# Patient Record
Sex: Male | Born: 1935 | Race: White | Hispanic: No | Smoking: Former smoker
Health system: Southern US, Community
[De-identification: ages and names within clinical notes are randomized; demographics above are authoritative.]

## PROBLEM LIST (undated history)

## (undated) DIAGNOSIS — I639 Cerebral infarction, unspecified: Secondary | ICD-10-CM

## (undated) DIAGNOSIS — D696 Thrombocytopenia, unspecified: Secondary | ICD-10-CM

## (undated) DIAGNOSIS — K219 Gastro-esophageal reflux disease without esophagitis: Secondary | ICD-10-CM

## (undated) DIAGNOSIS — I10 Essential (primary) hypertension: Secondary | ICD-10-CM

## (undated) DIAGNOSIS — E119 Type 2 diabetes mellitus without complications: Secondary | ICD-10-CM

## (undated) HISTORY — DX: Cerebral infarction, unspecified: I63.9

---

## 2004-08-07 ENCOUNTER — Ambulatory Visit: Payer: Self-pay | Admitting: Family Medicine

## 2004-09-14 ENCOUNTER — Emergency Department: Payer: Self-pay | Admitting: Emergency Medicine

## 2004-10-11 ENCOUNTER — Inpatient Hospital Stay: Payer: Self-pay

## 2006-06-13 ENCOUNTER — Ambulatory Visit: Payer: Self-pay | Admitting: Ophthalmology

## 2006-06-18 ENCOUNTER — Ambulatory Visit: Payer: Self-pay | Admitting: Ophthalmology

## 2006-07-17 ENCOUNTER — Ambulatory Visit: Payer: Self-pay | Admitting: Ophthalmology

## 2009-10-11 ENCOUNTER — Ambulatory Visit: Payer: Self-pay | Admitting: Unknown Physician Specialty

## 2010-02-23 ENCOUNTER — Telehealth (INDEPENDENT_AMBULATORY_CARE_PROVIDER_SITE_OTHER): Payer: Self-pay | Admitting: *Deleted

## 2010-02-23 ENCOUNTER — Ambulatory Visit: Payer: Self-pay | Admitting: Cardiovascular Disease

## 2010-02-23 DIAGNOSIS — R0602 Shortness of breath: Secondary | ICD-10-CM

## 2010-02-23 DIAGNOSIS — R42 Dizziness and giddiness: Secondary | ICD-10-CM

## 2010-02-23 DIAGNOSIS — R9431 Abnormal electrocardiogram [ECG] [EKG]: Secondary | ICD-10-CM

## 2010-02-27 ENCOUNTER — Ambulatory Visit: Payer: Self-pay | Admitting: Cardiovascular Disease

## 2010-02-27 ENCOUNTER — Encounter: Payer: Self-pay | Admitting: Cardiovascular Disease

## 2010-02-27 ENCOUNTER — Ambulatory Visit: Payer: Self-pay

## 2010-02-27 ENCOUNTER — Encounter (HOSPITAL_COMMUNITY): Admission: RE | Admit: 2010-02-27 | Discharge: 2010-04-19 | Payer: Self-pay | Admitting: Cardiovascular Disease

## 2010-02-28 ENCOUNTER — Ambulatory Visit: Payer: Self-pay

## 2010-02-28 ENCOUNTER — Encounter: Payer: Self-pay | Admitting: Cardiovascular Disease

## 2010-09-13 NOTE — Progress Notes (Signed)
Summary: Nuclear Pre-procedure  Phone Note Outgoing Call Call back at [facility] Phone [phone]   Call placed by: Stanton Kidney, EMT-P,  February 23, 2010 4:15 PM Call placed to: Patient Action Taken: Phone Call Completed Summary of Call: Reviewed information on Myoview Information Sheet (see scanned document for further details).  Spoke with Patient.    Nuclear Med Background Indications for Stress Test: Evaluation for Ischemia     Symptoms: Dizziness, SOB    Nuclear Pre-Procedure Height (in): 70

## 2010-09-13 NOTE — Assessment & Plan Note (Signed)
Summary: Cardiology Nuclear Testing  Nuclear Med Background Indications for Stress Test: Evaluation for Ischemia     Symptoms: Dizziness, SOB    Nuclear Pre-Procedure Caffeine/Decaff Intake: None NPO After: 10:00 PM Lungs: clear IV 0.9% NS with Angio Cath: 22g     IV Site: (R) Hand IV Started by: Stanton Kidney EMT-P Chest Size (in) 48     Height (in): 70.5 Weight (lb): 217 BMI: 30.81  Nuclear Med Study 1 or 2 day study:  1 day     Stress Test Type:  Eugenie Birks Reading MD:  Charlton Haws, MD     Referring MD:  T.Gollan Resting Radionuclide:  Technetium 36m Tetrofosmin     Resting Radionuclide Dose:  11.0 mCi  Stress Radionuclide:  Technetium 18m Tetrofosmin     Stress Radionuclide Dose:  33.0 mCi   Stress Protocol   Lexiscan: 0.4 mg   Stress Test Technologist:  Milana Na EMT-P     Nuclear Technologist:  Burna Mortimer Deal RT-N  Rest Procedure  Myocardial perfusion imaging was performed at rest 45 minutes following the intravenous administration of Myoview Technetium 71m Tetrofosmin.  Stress Procedure  The patient received IV Lexiscan 0.4 mg over 15-seconds.  Myoview injected at 30-seconds.  There were no significant changes with infusion.  Quantitative spect images were obtained after a 45 minute delay.  QPS Raw Data Images:  Normal; no motion artifact; normal heart/lung ratio. Stress Images:  NI: Uniform and normal uptake of tracer in all myocardial segments. Rest Images:  Normal homogeneous uptake in all areas of the myocardium. Subtraction (SDS):  Normal Transient Ischemic Dilatation:  1.13  (Normal <1.22)  Lung/Heart Ratio:  .27  (Normal <0.45)  Quantitative Gated Spect Images QGS EDV:  61 ml QGS ESV:  20 ml QGS EF:  68 % QGS cine images:  normal  Findings Normal nuclear study      Overall Impression  Exercise Capacity: Lexiscan BP Response: Normal blood pressure response. Clinical Symptoms: Chest Tightness ECG Impression: No significant ST segment  change suggestive of ischemia. Overall Impression: Normal stress nuclear study. Overall Impression Comments: normal  Appended Document: Cardiology Nuclear Testing Preliminarily reviewed. Forwarded to MD desktop for review and signature   Appended Document: Cardiology Nuclear Testing stress test is normal. Still waiting for ECHO  Appended Document: Cardiology Nuclear Testing pt aware of results asked to fax results to Dr. Sullivan Lone.

## 2010-09-13 NOTE — Assessment & Plan Note (Signed)
Summary: NEW PT   Visit Type:  new pt visit Primary Provider:  Dr. Sullivan Lone  CC:  sob.....  History of Present Illness: Paul Martinez is a 77 year old gentleman who is a patient of Dr. Sullivan Lone with no known coronary artery disease, a long smoking history for 40 years one pack per day stopping in 1980s, with a significant family history of stroke on his mother and father's side who presents with increasing shortness of breath, dizziness over the past several weeks.  He reports that he plays softball and be active. With any significant activity, he has significant shortness of breath as well as some dizziness and has to stop. He has significant fatigue with taking a shower. He does have significant hip pain and is not sure if this is due to arthritis. This happens bilaterally. He denies any coughing, edema, diaphoresis. He denies any wheezing. Occasionally with his shortness of breath, he has some tightness in the chest.  EKG shows normal sinus rhythm with rate of 65 beats per minute, nonspecific ST changes in the anterolateral leads  Current Medications (verified): 1)  Niaspan 500 Mg Cr-Tabs (Niacin (Antihyperlipidemic)) .Marland Kitchen.. 1 Tab At Bedtime 2)  Pantoprazole Sodium 40 Mg Tbec (Pantoprazole Sodium) .Marland Kitchen.. 1 Tab Once Daily 3)  Lisinopril-Hydrochlorothiazide 20-25 Mg Tabs (Lisinopril-Hydrochlorothiazide) .Marland Kitchen.. 1 Tab Once Daily 4)  Mirtazapine 30 Mg Tabs (Mirtazapine) .Marland Kitchen.. 1 Tab Once Daily 5)  Aspirin 81 Mg Tbec (Aspirin) .... Take One Tablet By Mouth Daily 6)  Fish Oil 1000 Mg Caps (Omega-3 Fatty Acids) .Marland Kitchen.. 1 Cap Once Daily 7)  Ranitidine Hcl 150 Mg Caps (Ranitidine Hcl) .Marland Kitchen.. 1 Cap Two Times A Day  Allergies (verified): No Known Drug Allergies  Past History:  Review of Systems       The patient complains of dyspnea on exertion.  The patient denies fever, weight loss, weight gain, vision loss, decreased hearing, hoarseness, chest pain, syncope, peripheral edema, prolonged cough, abdominal pain,  incontinence, muscle weakness, depression, and enlarged lymph nodes.         Dizzy, chest tightness with SOB  Vital Signs:  Patient profile:   77 year old male Height:      70 inches Weight:      221 pounds BMI:     31.82 Pulse rate:   65 / minute Pulse rhythm:   irregular BP sitting:   136 / 74  (left arm) Cuff size:   large  Vitals Entered By: Danielle Rankin, CMA (February 23, 2010 10:19 AM)  Physical Exam  General:  Well developed, well nourished, in no acute distress. Head:  normocephalic and atraumatic Neck:  Neck supple, no JVD. No masses, thyromegaly or abnormal cervical nodes. Chest Wall:  no deformities or breast masses noted Lungs:  Clear bilaterally to auscultation and percussion. Heart:  Non-displaced PMI, chest non-tender; regular rate and rhythm, S1, S2 with I-II/VI SEM RSB, no rubs or gallops. Carotid upstroke normal, no bruit. . Pedals normal pulses. No edema, no varicosities. Abdomen:  Bowel sounds positive; abdomen soft and non-tender without masses Msk:  Back normal, normal gait. Muscle strength and tone normal. Pulses:  pulses normal in all 4 extremities Extremities:  No clubbing or cyanosis. Neurologic:  Alert and oriented x 3. Skin:  Intact without lesions or rashes. Psych:  Normal affect.   Impression & Recommendations:  Problem # 1:  DYSPNEA (ICD-786.05) etiology of his shortness of breath is uncertain. He does have some dizziness, abnormal EKG, some chest tightness with exertion in the setting of  shortness of breath. Given his age, EKG changes, inability to treadmill due to his severe hip discomfort, we will set him up for a Lexiscan.   we'll also set him up for an echocardiogram to rule out pulmonary hypertension, underlying valvular disease as a cause of his shortness of breath.  We will send these reports to Dr. Sullivan Lone as soon as they are available.  His updated medication list for this problem includes:    Lisinopril-hydrochlorothiazide 20-25 Mg Tabs  (Lisinopril-hydrochlorothiazide) .Marland Kitchen... 1 tab once daily    Aspirin 81 Mg Tbec (Aspirin) .Marland Kitchen... Take one tablet by mouth daily  Orders: Echocardiogram (Echo) Nuclear Stress Test (Nuc Stress Test)  Patient Instructions: 1)  Your physician has requested that you have an echocardiogram.  Echocardiography is a painless test that uses sound waves to create images of your heart. It provides your doctor with information about the size and shape of your heart and how well your heart's chambers and valves are working.  This procedure takes approximately one hour. There are no restrictions for this procedure. 2)  Your physician has requested that you have an [employer]  For further information please visit [email or web address]  Please follow instruction sheet, as given. 3)  Your physician recommends that you schedule a follow-up appointment as needed

## 2010-09-13 NOTE — Letter (Signed)
Summary: Historic Patient File  Historic Patient File   Imported By: [place] 02/23/2010 14:26:35  _____________________________________________________________________  External Attachment:    Type:   Image     Comment:   External Document

## 2010-12-19 ENCOUNTER — Ambulatory Visit: Payer: Self-pay | Admitting: Internal Medicine

## 2011-02-23 ENCOUNTER — Encounter: Payer: Self-pay | Admitting: Cardiovascular Disease

## 2013-09-06 ENCOUNTER — Inpatient Hospital Stay: Payer: Self-pay | Admitting: Internal Medicine

## 2013-09-06 LAB — URINALYSIS, COMPLETE
Bilirubin,UR: NEGATIVE
Blood: NEGATIVE
Glucose,UR: NEGATIVE mg/dL (ref 0–75)
Hyaline Cast: 7
KETONE: NEGATIVE
Leukocyte Esterase: NEGATIVE
Nitrite: NEGATIVE
Ph: 5 (ref 4.5–8.0)
Protein: NEGATIVE
RBC,UR: 1 /HPF (ref 0–5)
Specific Gravity: 1.02 (ref 1.003–1.030)
Squamous Epithelial: 1

## 2013-09-06 LAB — CBC
HCT: 46.1 % (ref 40.0–52.0)
HGB: 15.4 g/dL (ref 13.0–18.0)
MCH: 30.7 pg (ref 26.0–34.0)
MCHC: 33.3 g/dL (ref 32.0–36.0)
MCV: 92 fL (ref 80–100)
Platelet: 147 10*3/uL — ABNORMAL LOW (ref 150–440)
RBC: 5.01 10*6/uL (ref 4.40–5.90)
RDW: 12.9 % (ref 11.5–14.5)
WBC: 6.2 10*3/uL (ref 3.8–10.6)

## 2013-09-06 LAB — COMPREHENSIVE METABOLIC PANEL
AST: 35 U/L (ref 15–37)
Albumin: 3.9 g/dL (ref 3.4–5.0)
Alkaline Phosphatase: 86 U/L
Anion Gap: 6 — ABNORMAL LOW (ref 7–16)
BILIRUBIN TOTAL: 0.7 mg/dL (ref 0.2–1.0)
BUN: 23 mg/dL — ABNORMAL HIGH (ref 7–18)
CALCIUM: 8.8 mg/dL (ref 8.5–10.1)
CREATININE: 1.96 mg/dL — AB (ref 0.60–1.30)
Chloride: 105 mmol/L (ref 98–107)
Co2: 28 mmol/L (ref 21–32)
EGFR (African American): 36 — ABNORMAL LOW
EGFR (Non-African Amer.): 31 — ABNORMAL LOW
Glucose: 132 mg/dL — ABNORMAL HIGH (ref 65–99)
OSMOLALITY: 283 (ref 275–301)
Potassium: 3.8 mmol/L (ref 3.5–5.1)
SGPT (ALT): 55 U/L (ref 12–78)
SODIUM: 139 mmol/L (ref 136–145)
Total Protein: 7.3 g/dL (ref 6.4–8.2)

## 2013-09-06 LAB — TROPONIN I: Troponin-I: <0.02 ng/mL

## 2013-09-07 LAB — BASIC METABOLIC PANEL
Anion Gap: 3 — ABNORMAL LOW (ref 7–16)
BUN: 23 mg/dL — ABNORMAL HIGH (ref 7–18)
CHLORIDE: 108 mmol/L — AB (ref 98–107)
Calcium, Total: 8.7 mg/dL (ref 8.5–10.1)
Co2: 28 mmol/L (ref 21–32)
Creatinine: 1.93 mg/dL — ABNORMAL HIGH (ref 0.60–1.30)
EGFR (African American): 37 — ABNORMAL LOW
GFR CALC NON AF AMER: 32 — AB
Glucose: 106 mg/dL — ABNORMAL HIGH (ref 65–99)
Osmolality: 282 (ref 275–301)
POTASSIUM: 4.3 mmol/L (ref 3.5–5.1)
Sodium: 139 mmol/L (ref 136–145)

## 2013-09-07 LAB — CBC WITH DIFFERENTIAL/PLATELET
BASOS PCT: 0.7 %
Basophil #: 0 10*3/uL (ref 0.0–0.1)
Eosinophil #: 0.2 10*3/uL (ref 0.0–0.7)
Eosinophil %: 2.6 %
HCT: 43.9 % (ref 40.0–52.0)
HGB: 14.6 g/dL (ref 13.0–18.0)
LYMPHS PCT: 47.2 %
Lymphocyte #: 2.9 10*3/uL (ref 1.0–3.6)
MCH: 31.1 pg (ref 26.0–34.0)
MCHC: 33.2 g/dL (ref 32.0–36.0)
MCV: 94 fL (ref 80–100)
MONO ABS: 0.5 x10 3/mm (ref 0.2–1.0)
Monocyte %: 8.5 %
NEUTROS ABS: 2.5 10*3/uL (ref 1.4–6.5)
NEUTROS PCT: 41 %
PLATELETS: 128 10*3/uL — AB (ref 150–440)
RBC: 4.69 10*6/uL (ref 4.40–5.90)
RDW: 12.9 % (ref 11.5–14.5)
WBC: 6.1 10*3/uL (ref 3.8–10.6)

## 2013-09-07 LAB — LIPID PANEL
Cholesterol: 176 mg/dL (ref 0–200)
HDL Cholesterol: 30 mg/dL — ABNORMAL LOW (ref 40–60)
LDL CHOLESTEROL, CALC: 100 mg/dL (ref 0–100)
Triglycerides: 231 mg/dL — ABNORMAL HIGH (ref 0–200)
VLDL Cholesterol, Calc: 46 mg/dL — ABNORMAL HIGH (ref 5–40)

## 2013-09-29 ENCOUNTER — Ambulatory Visit: Payer: Self-pay | Admitting: Internal Medicine

## 2014-12-04 NOTE — Discharge Summary (Signed)
PATIENT NAME:  Paul Martinez, Paul Martinez MR#:  [id number] DATE OF BIRTH:  [birth year]  DATE OF ADMISSION:  09/06/2013 DATE OF DISCHARGE:  09/07/2013  DISCHARGE DIAGNOSES:  1.  Slurred speech with right upper extremity numbness, possible cerebrovascular accident.  2.  History of previous cerebrovascular accident.  3.  Hypertension.  4.  Hyperlipidemia.  5.  Gastroesophageal reflux disease.  6.  Cervical degenerative disk disease.   CHIEF COMPLAINT: Slurred speech with right upper extremity numbness.   HISTORY OF PRESENT ILLNESS: Paul Martinez is an 81 year old male with a history of hypertension, hyperlipidemia, and acid reflux who presented to the ER complaining of right upper extremity pain associated with weakness and numbness. The patient has a past medical history significant for hypertension, hyperlipidemia, GERD, history of diverticulosis, and history of hemorrhoids.   PHYSICAL EXAMINATION: GENERAL: He was not in distress.  VITAL SIGNS: Blood pressure 128/74, heart rate 56, respirations 18, temp 98.1.  HEENT: NCAT. Oropharynx clear.  NECK: Supple. No bruits.  LUNGS: Clear to auscultation.  HEART: S1, S2.  ABDOMEN: Soft, nontender.  EXTREMITIES: No edema.  NEUROLOGIC: Alert and oriented x3. Cranial nerves intact. Deep tendon reflexes were symmetric and motor and sensory exam was nonfocal.  LABORATORY AND DIAGNOSTICS: Glucose 132, BUN 23, creatinine 1.96, GFR 31. Troponin less than 0.02. WBC count 6.3, hemoglobin 15.4.  Chest x-ray was unremarkable.   EKG is sinus rhythm. No acute changes.   CT of the head revealed no acute intracranial process. There was moderate to severe white matter changes and chronic small vessel ischemic disease with bilateral basal ganglia lacunar infarct.   CT of the spine revealed degenerative disk disease, moderate canal stenosis at C6 and C7, as well as narrowing at C2 and C3.   The patient also underwent a MRI of the brain which did not reveal any evidence  of acute infarct or other acute intracerebral abnormality. There were progressive chronic microvascular ischemic changes with chronic infarcts.  HOSPITAL COURSE: During his stay in the hospital, the patient remained stable. He did have a renal impairment with creatinine 1.93 and this had improved from 1.96 with IV fluids. BUN was 23, potassium 4.3, chloride 108, and CO2 of 28. Troponin was negative. He was ambulated and appeared stable at time of discharge. A carotid ultrasound showed bilateral carotid atherosclerotic vascular disease. No hemodynamically significant stenosis. The vertebrals were patent with antegrade flow. The patient was discharged in stable condition on the following medications.   DISCHARGE MEDICATIONS: 1.  Lisinopril/HCT 25/20 mg 1 tab once a day. 2.  Mirtazapine 30 mg once a day at bedtime. 3.  Pantoprazole 40 mg once a day. 4.  Aspirin 81 mg once a day. 5.  Fish oil 1200 mg t.i.d.  6.  Cyclobenzaprine 10 mg once a day at bedtime. 7.  Clopidogrel 75 mg once a day. 8.  Celecoxib 200 mg once a day. 9.  Aspirin 81 mg a day. 10.  Iron plus vitamin B complex with C and folic acid 1 tablet once a day.   DISCHARGE INSTRUCTIONS: The patient has been advised to follow with me, Dr. Marcello FennelHande, in 1 to 2 weeks' time, advised to call back with any questions or concerns.   TOTAL TIME SPENT ON DISCHARGE: 35 minutes.   ____________________________ Barbette ReichmannVishwanath Talina Pleitez, MD vh:sb D: 09/07/2013 12:52:29 ET T: 09/07/2013 13:21:21 ET JOB#: [id number]  cc: Barbette ReichmannVishwanath Asherah Lavoy, MD, <Dictator> Barbette ReichmannVISHWANATH Shontez Sermon MD ELECTRONICALLY SIGNED 09/22/2013 13:16

## 2014-12-04 NOTE — H&P (Signed)
PATIENT NAME:  [name], Ancel H MR#:  [id number] DATE OF BIRTH:  September 11, 1929  DATE OF ADMISSION:  09/06/2013  REFERRING PHYSICIAN: Dr. Zenda AlpersWebster in the Emergency Room.   FAMILY PHYSICIAN: Dr. Marcello FennelHande.   REASON FOR ADMISSION: Slurred speech with right upper extremity numbness.   HISTORY OF PRESENT ILLNESS: The patient is an 81 year old male with a history of benign hypertension, hyperlipidemia and reflux. He has a strong family history of stroke. Presents to the Emergency Room with slurred speech associated with right upper extremity weakness and numbness. In the Emergency Room, the patient's head CT revealed old lacunar infarcts with no acute abnormality. He was not aware of any previous stroke. He is now admitted for further evaluation.   PAST MEDICAL HISTORY:  1. Benign hypertension.  2. Hyperlipidemia.  3. GE reflux disease.  4. History of diverticulosis.  5. History of hemorrhoids.   MEDICATIONS:  1. Aspirin 81 mg p.o. daily.  2. Pravachol 40 mg p.o. at bedtime.  3. Protonix 40 mg p.o. daily.  4. Zestoretic 20/25, 1 p.o. daily.  5. Remeron 30 mg p.o. at bedtime.  6. Fish oil 1200 mg p.o. daily.   ALLERGIES: PENICILLIN.   SOCIAL HISTORY: Negative for alcohol or tobacco abuse.   FAMILY HISTORY: Positive for stroke, but otherwise unremarkable.   REVIEW OF SYSTEMS:  CONSTITUTIONAL: No fever or change in weight.  EYES: No blurred or double vision. No glaucoma.  ENT: No tinnitus or hearing loss. No nasal discharge or bleeding. No difficulty swallowing.  RESPIRATORY: No cough or wheezing. Denies hemoptysis.  CARDIOVASCULAR: No chest pain or orthopnea. No palpitations.  GASTROINTESTINAL: No nausea, vomiting, or diarrhea. No abdominal pain. No change in bowel habits.  GENITOURINARY: No dysuria or hematuria. No incontinence.  ENDOCRINE: No polyuria or polydipsia. No heat or cold intolerance.  HEMATOLOGIC: The patient denies anemia, easy bruising or bleeding.  LYMPHATIC: No swollen  glands.  MUSCULOSKELETAL: The patient denies pain in his neck, back, knees or hips. He is having some shoulder and right upper extremity pain.  NEUROLOGIC: Numbness and weakness as per HPI. No migraines. Denies seizures.  PSYCHIATRIC: The patient denies anxiety, insomnia or depression.   PHYSICAL EXAMINATION: GENERAL: The patient is in no acute distress.  VITAL SIGNS: Currently remarkable for a blood pressure of 128/74 with a heart rate of 56, respiratory rate of 18, temperature of 98.1.  HEENT: Normocephalic, atraumatic. Pupils equal, round, reactive to light and accommodation. Extraocular movements are intact. Sclerae are anicteric. Conjunctivae are clear.  OROPHARYNX: Clear.  NECK: Supple without bruits. No adenopathy or thyromegaly is noted. No JVD.  LUNGS: Clear to auscultation and percussion without wheezes, rales or rhonchi. No dullness. Respiratory effort is normal.  CARDIAC: Regular rate and rhythm with normal S1, S2. No significant rubs, murmurs or gallops. PMI is nondisplaced. Chest wall is nontender.  ABDOMEN: Soft, nontender, with normoactive bowel sounds. No organomegaly or masses were appreciated. No hernias or bruits were noted.  EXTREMITIES: Without clubbing, cyanosis or edema. Pulses were 2+ bilaterally.  SKIN: Warm and dry without rash or lesions.  NEUROLOGIC: Cranial nerves II through XII grossly intact. Deep tendon reflexes were symmetric. Motor and sensory examination is nonfocal.  PSYCHIATRIC: Exam revealed a patient who is alert and oriented to person, place, and time. He was cooperative and used good judgment.   LABORATORY DATA: Glucose is 132 with a BUN of 23, creatinine 1.96. GFR of 31. Troponin was less than 0.02. White count was 6.2 with a hemoglobin of 15.4. Chest  x-ray was unremarkable. EKG revealed sinus rhythm with no acute ischemic changes. CT of the head revealed no acute intracranial process with moderate to severe white matter changes and chronic small vessel  ischemic disease with bilateral basal ganglia lacunar infarct. CT of the C-spine revealed degenerative disk disease with moderate canal stenosis at C6-C7, as well as narrowing at C2 and C3.    ASSESSMENT: 1. Slurred speech with right upper extremity numbness worrisome for stroke.  2. Previous lacunar infarct by CT.  3. Cervical degenerative disk disease.  4. Benign hypertension.  5. Hyperlipidemia.  6. Family history of stroke.  7. Gastroesophageal reflux disease.   PLAN: The patient will be admitted to the floor with aspirin and Plavix. We will obtain carotid Dopplers and an echocardiogram. We will also proceed with MRI of the brain. We will monitor his blood pressure closely and check on his lipid status. Neuro checks q.4 hours. We will  consult physical therapy and occupational therapy. Begin Celebrex and Flexeril. Follow up routine labs in the morning. Further treatment and evaluation will depend upon the patient's progress.   TOTAL TIME SPENT: 50 minutes.    ____________________________ Duane Lope Judithann Sheen, MD jds:sg D: 09/06/2013 09:48:27 ET T: 09/06/2013 11:17:15 ET JOB#: [id number]  cc: Duane Lope. Judithann Sheen, MD, <Dictator> Barbette Reichmann, MD  Jamone Garrido Rodena Medin MD ELECTRONICALLY SIGNED 09/06/2013 14:40

## 2017-09-29 ENCOUNTER — Inpatient Hospital Stay
Admission: EM | Admit: 2017-09-29 | Discharge: 2017-09-30 | DRG: 084 | Disposition: A | Discharge status: Home / Self Care | Payer: Medicare Other | Attending: Internal Medicine | Admitting: Internal Medicine

## 2017-09-29 ENCOUNTER — Encounter: Payer: Self-pay | Admitting: Emergency Medicine

## 2017-09-29 ENCOUNTER — Emergency Department: Payer: Medicare Other

## 2017-09-29 DIAGNOSIS — N183 Chronic kidney disease, stage 3 (moderate): Secondary | ICD-10-CM | POA: Diagnosis not present

## 2017-09-29 DIAGNOSIS — S5001XA Contusion of right elbow, initial encounter: Secondary | ICD-10-CM | POA: Diagnosis not present

## 2017-09-29 DIAGNOSIS — S5011XA Contusion of right forearm, initial encounter: Secondary | ICD-10-CM | POA: Diagnosis not present

## 2017-09-29 DIAGNOSIS — S065X9A Traumatic subdural hemorrhage with loss of consciousness of unspecified duration, initial encounter: Secondary | ICD-10-CM | POA: Diagnosis not present

## 2017-09-29 DIAGNOSIS — E1122 Type 2 diabetes mellitus with diabetic chronic kidney disease: Secondary | ICD-10-CM | POA: Diagnosis present

## 2017-09-29 DIAGNOSIS — S8002XA Contusion of left knee, initial encounter: Secondary | ICD-10-CM

## 2017-09-29 DIAGNOSIS — Z880 Allergy status to penicillin: Secondary | ICD-10-CM

## 2017-09-29 DIAGNOSIS — F0390 Unspecified dementia without behavioral disturbance: Secondary | ICD-10-CM | POA: Diagnosis not present

## 2017-09-29 DIAGNOSIS — E041 Nontoxic single thyroid nodule: Secondary | ICD-10-CM | POA: Diagnosis present

## 2017-09-29 DIAGNOSIS — Z87891 Personal history of nicotine dependence: Secondary | ICD-10-CM | POA: Diagnosis not present

## 2017-09-29 DIAGNOSIS — Z7982 Long term (current) use of aspirin: Secondary | ICD-10-CM | POA: Diagnosis not present

## 2017-09-29 DIAGNOSIS — I129 Hypertensive chronic kidney disease with stage 1 through stage 4 chronic kidney disease, or unspecified chronic kidney disease: Secondary | ICD-10-CM | POA: Diagnosis not present

## 2017-09-29 DIAGNOSIS — Z8673 Personal history of transient ischemic attack (TIA), and cerebral infarction without residual deficits: Secondary | ICD-10-CM | POA: Diagnosis not present

## 2017-09-29 DIAGNOSIS — S92352A Displaced fracture of fifth metatarsal bone, left foot, initial encounter for closed fracture: Secondary | ICD-10-CM

## 2017-09-29 DIAGNOSIS — E1165 Type 2 diabetes mellitus with hyperglycemia: Secondary | ICD-10-CM | POA: Diagnosis not present

## 2017-09-29 DIAGNOSIS — Z79899 Other long term (current) drug therapy: Secondary | ICD-10-CM | POA: Diagnosis not present

## 2017-09-29 DIAGNOSIS — H9190 Unspecified hearing loss, unspecified ear: Secondary | ICD-10-CM | POA: Diagnosis not present

## 2017-09-29 DIAGNOSIS — S065X0A Traumatic subdural hemorrhage without loss of consciousness, initial encounter: Secondary | ICD-10-CM

## 2017-09-29 DIAGNOSIS — R52 Pain, unspecified: Secondary | ICD-10-CM

## 2017-09-29 DIAGNOSIS — M4692 Unspecified inflammatory spondylopathy, cervical region: Secondary | ICD-10-CM | POA: Diagnosis not present

## 2017-09-29 DIAGNOSIS — E1121 Type 2 diabetes mellitus with diabetic nephropathy: Secondary | ICD-10-CM | POA: Diagnosis not present

## 2017-09-29 DIAGNOSIS — W010XXA Fall on same level from slipping, tripping and stumbling without subsequent striking against object, initial encounter: Secondary | ICD-10-CM | POA: Diagnosis present

## 2017-09-29 DIAGNOSIS — I619 Nontraumatic intracerebral hemorrhage, unspecified: Secondary | ICD-10-CM | POA: Diagnosis present

## 2017-09-29 HISTORY — DX: Essential (primary) hypertension: I10

## 2017-09-29 HISTORY — DX: Type 2 diabetes mellitus without complications: E11.9

## 2017-09-29 NOTE — ED Triage Notes (Signed)
Pt comes into the ED via POV c/o fall from where he slipped in a wet bathroom.  Patient denies any LOC or discomfort other than right elbow and right knee.  The fall was witnessed but the patient did not hit his head.  Patient is neurologically intact and in NAD with even and unlabored respirations.

## 2017-09-29 NOTE — ED Notes (Signed)
Pt states he slipped on tile floor in the bathroom. Pt states he landed on his right posterior elbow. Pt states he did strike posterior skull on toilet when he fell. Pt also complains of left knee pain and left foot pain. Cms intact to all extremities. Pt denies loc or nausea/vomiting.

## 2017-09-29 NOTE — ED Provider Notes (Signed)
[facility] Emergency Department Provider Note   ____________________________________________   First MD Initiated Contact with Patient 09/29/17 2121     (approximate)  I have reviewed the triage vital signs and the nursing notes.   HISTORY  Chief Complaint Fall    HPI Paul Martinez is a 84 y.o. male patient states pain to the right elbow, right knee, and left foot secondary to a slip and fall.  Patient had a witnessed fall without LOC in a wet bathroom.  Patient hit the back of his head during the fall.  Patient denies vision disturbance or vertigo.  Patient rates pain as a 2/10.  Patient described the pain as "aching".  Patient is here with children and requires clearance to return back to nursing home.    Past Medical History:  Diagnosis Date  . Diabetes mellitus without complication (HCC)   . Hypertension     Patient Active Problem List   Diagnosis Date Noted  . DIZZINESS 02/23/2010  . DYSPNEA 02/23/2010  . ABNORMAL ELECTROCARDIOGRAM 02/23/2010    History reviewed. No pertinent surgical history.  Prior to Admission medications   Medication Sig Start Date End Date Taking? Authorizing Provider  aspirin 81 MG EC tablet Take 81 mg by mouth daily.      [provider]  lisinopril-hydrochlorothiazide (PRINZIDE,ZESTORETIC) 20-12.5 MG per tablet Take 1 tablet by mouth daily.      [provider]  mirtazapine (REMERON) 30 MG tablet Take 30 mg by mouth daily.      [provider]  niacin (NIASPAN) 500 MG CR tablet Take 500 mg by mouth at bedtime.      [provider]  Omega-3 Fatty Acids (FISH OIL) 1000 MG CAPS Take 1 capsule by mouth daily.      [provider]  pantoprazole (PROTONIX) 40 MG tablet Take 40 mg by mouth daily.      [provider]  ranitidine (ZANTAC) 150 MG capsule Take 150 mg by mouth 2 (two) times daily.      [provider]    Allergies Penicillins  No family  history on file.  Social History Social History   Tobacco Use  . Smoking status: Former Games developer  . Smokeless tobacco: Never Used  Substance Use Topics  . Alcohol use: No    Frequency: Never  . Drug use: No    Review of Systems Constitutional: No fever/chills Eyes: No visual changes. ENT: No sore throat. Cardiovascular: Denies chest pain. Respiratory: Denies shortness of breath. Gastrointestinal: No abdominal pain.  No nausea, no vomiting.  No diarrhea.  No constipation. Genitourinary: Negative for dysuria. Musculoskeletal: Right elbow, right knee, and left foot pain. Skin: Negative for rash. Neurological: Positive for headaches, but denies focal weakness or numbness. Endocrine:Diabetes and hypertension Allergic/Immunilogical: Penicillin ____________________________________________   PHYSICAL EXAM:  VITAL SIGNS: ED Triage Vitals  Enc Vitals Group     BP 09/29/17 2029 (!) 142/65     Pulse Rate 09/29/17 2029 72     Resp 09/29/17 2029 18     Temp 09/29/17 2029 98.1 F (36.7 C)     Temp Source 09/29/17 2029 Oral     SpO2 09/29/17 2029 94 %     Weight 09/29/17 2029 200 lb (90.7 kg)     Height 09/29/17 2029 5\' 11"  (1.803 m)     Head Circumference --      Peak Flow --      Pain Score 09/29/17 2052 2  Pain Loc --      Pain Edu? --      Excl. in GC? --    Constitutional: Alert and oriented. Well appearing and in no acute distress. Eyes: Conjunctivae are normal. PERRL. EOMI. Head: Atraumatic. Nose: No congestion/rhinnorhea. Mouth/Throat: Mucous membranes are moist.  Oropharynx non-erythematous. Neck: No stridor. No cervical spine tenderness to palpation. Cardiovascular: Normal rate, regular rhythm. Grossly normal heart sounds.  Good peripheral circulation. Respiratory: Normal respiratory effort.  No retractions. Lungs CTAB. Musculoskeletal: No obvious deformity to the right knee or left foot.  Patient had mild guarding palpation of the right knee. Neurologic:   Normal speech and language. No gross focal neurologic deficits are appreciated. No gait instability. Skin:  Skin is warm, dry and intact. No rash noted. Psychiatric: Mood and affect are normal. Speech and behavior are normal.  ____________________________________________   LABS (all labs ordered are listed, but only abnormal results are displayed)  Labs Reviewed - No data to display ____________________________________________  EKG   ____________________________________________  RADIOLOGY  ED MD interpretation: Patient x-ray is remarkable for left fifth metatarsal fracture.  X-rays of the right elbow and left knee were unremarkable.  Official radiology report(s): Dg Elbow 2 Views Right  Result Date: 09/29/2017 CLINICAL DATA:  Fall, pain to right elbow. EXAM: RIGHT ELBOW - 2 VIEW COMPARISON:  None. FINDINGS: Osseous alignment is normal. No fracture line or displaced fracture fragment identified. No appreciable joint effusion and adjacent soft tissues are unremarkable. Mild degenerative spurring at the radial head. IMPRESSION: No acute findings. No osseous fracture or dislocation. Mild degenerative spurring. Electronically Signed   By: Bary RichardStan  Maynard M.D.   On: 09/29/2017 22:11   Ct Head Wo Contrast  Result Date: 09/29/2017 CLINICAL DATA:  Larey SeatFell in bathroom on hit posterior skull on toilet EXAM: CT HEAD WITHOUT CONTRAST TECHNIQUE: Contiguous axial images were obtained from the base of the skull through the vertex without intravenous contrast. COMPARISON:  CT 09/06/2013, MRI brain 09/07/2013 FINDINGS: Brain: No acute territorial infarction or intracranial mass is visualized. Acute right anterior parafalcine subdural hematoma measuring 4 mm in maximum thickness with thin right frontal convexity subdural hematoma measuring 2 mm. No midline shift. No mass effect. Moderate atrophy. Moderate small vessel ischemic changes of the white matter. Old lacunar infarcts in the right basal ganglia. 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 Vascular: No hyperdense vessels. Carotid vascular calcification and vertebral calcification Skull: No depressed skull fracture Sinuses/Orbits: No acute finding. Other: None IMPRESSION: 1. Acute right anterior parafalcine subdural hematoma with thin right frontal convexity subdural hematoma. No midline shift. No significant mass effect 2. Atrophy with small vessel ischemic changes of the white matter and old left cerebellar and occipital infarcts. Critical Value/emergent results were called by telephone at the time of interpretation on 09/29/2017 at 10:28 pm to Dr. Nona DellONALD Roseanna Koplin , who verbally acknowledged these results. Electronically Signed   By: Jasmine PangKim  Fujinaga M.D.   On: 09/29/2017 22:28   Dg Foot Complete Left  Result Date: 09/29/2017 CLINICAL DATA:  Slip and fall on bathroom floor with left foot pain. EXAM: LEFT FOOT - COMPLETE 3+ VIEW COMPARISON:  None. FINDINGS: Oblique mildly displaced and minimally comminuted fifth metatarsal fracture. No intra-articular extension. No additional acute fracture of the foot. There are hammertoe deformity of the digits. IMPRESSION: Oblique mildly displaced fifth metatarsal fracture without intra-articular extension. Electronically Signed   By: Rubye OaksMelanie  Ehinger M.D.   On: 09/29/2017 22:12    ____________________________________________   PROCEDURES  Procedure(s) performed: None  Procedures  Critical Care performed: No  ____________________________________________   INITIAL IMPRESSION / ASSESSMENT AND PLAN / ED COURSE  As part of my medical decision making, I reviewed the following data within the electronic MEDICAL RECORD NUMBER    Patient CT is consistent with acute right anterior subdural hematoma without midline shift.  X-rays remarkable for left metatarsal fracture.  X-rays of the right elbow and left knee were unremarkable.  Patient will be transferred to major for definitive evaluation and treatment.       ____________________________________________   FINAL CLINICAL IMPRESSION(S) / ED DIAGNOSES  Final diagnoses:  Subdural hematoma without coma, without loss of consciousness, initial encounter (HCC)  Displaced fracture of fifth metatarsal bone, left foot, initial encounter for closed fracture  Contusion of right elbow and forearm, initial encounter  Contusion of left knee, initial encounter     ED Discharge Orders    None       Note:  This document was prepared using Dragon voice recognition software and may include unintentional dictation errors.    Joni Reining, PA-C 09/29/17 2250    Merrily Brittle, MD 09/30/17 [phone]

## 2017-09-30 ENCOUNTER — Other Ambulatory Visit: Payer: Self-pay

## 2017-09-30 ENCOUNTER — Encounter: Payer: Self-pay | Admitting: *Deleted

## 2017-09-30 ENCOUNTER — Inpatient Hospital Stay: Payer: Medicare Other

## 2017-09-30 DIAGNOSIS — E1165 Type 2 diabetes mellitus with hyperglycemia: Secondary | ICD-10-CM | POA: Diagnosis not present

## 2017-09-30 DIAGNOSIS — S8002XA Contusion of left knee, initial encounter: Secondary | ICD-10-CM | POA: Diagnosis not present

## 2017-09-30 DIAGNOSIS — S5011XA Contusion of right forearm, initial encounter: Secondary | ICD-10-CM | POA: Diagnosis not present

## 2017-09-30 DIAGNOSIS — E1122 Type 2 diabetes mellitus with diabetic chronic kidney disease: Secondary | ICD-10-CM | POA: Diagnosis not present

## 2017-09-30 DIAGNOSIS — W010XXA Fall on same level from slipping, tripping and stumbling without subsequent striking against object, initial encounter: Secondary | ICD-10-CM | POA: Diagnosis not present

## 2017-09-30 DIAGNOSIS — Z880 Allergy status to penicillin: Secondary | ICD-10-CM | POA: Diagnosis not present

## 2017-09-30 DIAGNOSIS — I129 Hypertensive chronic kidney disease with stage 1 through stage 4 chronic kidney disease, or unspecified chronic kidney disease: Secondary | ICD-10-CM | POA: Diagnosis not present

## 2017-09-30 DIAGNOSIS — N183 Chronic kidney disease, stage 3 (moderate): Secondary | ICD-10-CM | POA: Diagnosis not present

## 2017-09-30 DIAGNOSIS — S065X9A Traumatic subdural hemorrhage with loss of consciousness of unspecified duration, initial encounter: Secondary | ICD-10-CM | POA: Diagnosis not present

## 2017-09-30 DIAGNOSIS — M4692 Unspecified inflammatory spondylopathy, cervical region: Secondary | ICD-10-CM | POA: Diagnosis not present

## 2017-09-30 DIAGNOSIS — I619 Nontraumatic intracerebral hemorrhage, unspecified: Secondary | ICD-10-CM | POA: Diagnosis present

## 2017-09-30 DIAGNOSIS — S92352A Displaced fracture of fifth metatarsal bone, left foot, initial encounter for closed fracture: Secondary | ICD-10-CM | POA: Diagnosis not present

## 2017-09-30 DIAGNOSIS — R52 Pain, unspecified: Secondary | ICD-10-CM | POA: Diagnosis present

## 2017-09-30 DIAGNOSIS — H9190 Unspecified hearing loss, unspecified ear: Secondary | ICD-10-CM | POA: Diagnosis not present

## 2017-09-30 DIAGNOSIS — S5001XA Contusion of right elbow, initial encounter: Secondary | ICD-10-CM | POA: Diagnosis not present

## 2017-09-30 DIAGNOSIS — Z79899 Other long term (current) drug therapy: Secondary | ICD-10-CM | POA: Diagnosis not present

## 2017-09-30 DIAGNOSIS — E041 Nontoxic single thyroid nodule: Secondary | ICD-10-CM | POA: Diagnosis not present

## 2017-09-30 DIAGNOSIS — Z7982 Long term (current) use of aspirin: Secondary | ICD-10-CM | POA: Diagnosis not present

## 2017-09-30 DIAGNOSIS — Z8673 Personal history of transient ischemic attack (TIA), and cerebral infarction without residual deficits: Secondary | ICD-10-CM | POA: Diagnosis not present

## 2017-09-30 DIAGNOSIS — E1121 Type 2 diabetes mellitus with diabetic nephropathy: Secondary | ICD-10-CM | POA: Diagnosis not present

## 2017-09-30 DIAGNOSIS — F0390 Unspecified dementia without behavioral disturbance: Secondary | ICD-10-CM | POA: Diagnosis not present

## 2017-09-30 DIAGNOSIS — Z87891 Personal history of nicotine dependence: Secondary | ICD-10-CM | POA: Diagnosis not present

## 2017-09-30 LAB — URINALYSIS, COMPLETE (UACMP) WITH MICROSCOPIC
BILIRUBIN URINE: NEGATIVE
Bacteria, UA: NONE SEEN
Hgb urine dipstick: NEGATIVE
KETONES UR: 5 mg/dL — AB
LEUKOCYTES UA: NEGATIVE
NITRITE: NEGATIVE
PH: 5 (ref 5.0–8.0)
Protein, ur: NEGATIVE mg/dL
Specific Gravity, Urine: 1.031 — ABNORMAL HIGH (ref 1.005–1.030)

## 2017-09-30 LAB — TYPE AND SCREEN
ABO/RH(D): O POS
Antibody Screen: NEGATIVE

## 2017-09-30 LAB — BASIC METABOLIC PANEL
Anion gap: 8 (ref 5–15)
BUN: 14 mg/dL (ref 6–20)
CHLORIDE: 104 mmol/L (ref 101–111)
CO2: 25 mmol/L (ref 22–32)
CREATININE: 1.35 mg/dL — AB (ref 0.61–1.24)
Calcium: 9.2 mg/dL (ref 8.9–10.3)
GFR calc non Af Amer: 46 mL/min — ABNORMAL LOW (ref >60)
GFR, EST AFRICAN AMERICAN: 54 mL/min — AB (ref >60)
GLUCOSE: 325 mg/dL — AB (ref 65–99)
Potassium: 4 mmol/L (ref 3.5–5.1)
Sodium: 137 mmol/L (ref 135–145)

## 2017-09-30 LAB — MRSA PCR SCREENING: MRSA BY PCR: NEGATIVE

## 2017-09-30 LAB — HEMOGLOBIN A1C
HEMOGLOBIN A1C: 13.2 % — AB (ref 4.8–5.6)
MEAN PLASMA GLUCOSE: 332.14 mg/dL

## 2017-09-30 LAB — CBC
HEMATOCRIT: 43.4 % (ref 40.0–52.0)
HEMOGLOBIN: 14.4 g/dL (ref 13.0–18.0)
MCH: 30.6 pg (ref 26.0–34.0)
MCHC: 33.1 g/dL (ref 32.0–36.0)
MCV: 92.3 fL (ref 80.0–100.0)
Platelets: 89 10*3/uL — ABNORMAL LOW (ref 150–440)
RBC: 4.7 MIL/uL (ref 4.40–5.90)
RDW: 13.9 % (ref 11.5–14.5)
WBC: 5.5 10*3/uL (ref 3.8–10.6)

## 2017-09-30 LAB — GLUCOSE, CAPILLARY
Glucose-Capillary: 370 mg/dL — ABNORMAL HIGH (ref 65–99)
Glucose-Capillary: 362 mg/dL — ABNORMAL HIGH (ref 65–99)

## 2017-09-30 LAB — PREPARE PLATELET PHERESIS: Unit division: 0

## 2017-09-30 LAB — PROTIME-INR
INR: 1.13
Prothrombin Time: 14.4 seconds (ref 11.4–15.2)

## 2017-09-30 LAB — BPAM PLATELET PHERESIS
Blood Product Expiration Date: 201902182359
Unit Type and Rh: 5100

## 2017-09-30 LAB — TROPONIN I: Troponin I: <0.03 ng/mL (ref <0.03)

## 2017-09-30 MED ORDER — ONDANSETRON HCL 4 MG/2ML IJ SOLN: 4.0000 mg | Freq: Four times a day (QID) | INTRAMUSCULAR | Status: DC | PRN

## 2017-09-30 MED ORDER — VITAMIN B-12 1000 MCG PO TABS: 1000.0000 ug | ORAL_TABLET | Freq: Every day | ORAL | Status: DC

## 2017-09-30 MED ORDER — INSULIN ASPART 100 UNIT/ML ~~LOC~~ SOLN: 0.0000 [IU] | Freq: Every day | SUBCUTANEOUS | Status: DC

## 2017-09-30 MED ORDER — INFLUENZA VAC SPLIT HIGH-DOSE 0.5 ML IM SUSY: 0.5000 mL | PREFILLED_SYRINGE | INTRAMUSCULAR | Status: DC

## 2017-09-30 MED ORDER — NIACIN ER (ANTIHYPERLIPIDEMIC) 500 MG PO TBCR
500.0000 mg | EXTENDED_RELEASE_TABLET | Freq: Every day | ORAL | Status: DC
  Filled 2017-09-30: qty 1

## 2017-09-30 MED ORDER — LISINOPRIL 20 MG PO TABS
40.0000 mg | ORAL_TABLET | Freq: Every day | ORAL | Status: DC
  Administered 2017-09-30: 10:00:00 40 mg via ORAL
  Filled 2017-09-30: qty 2

## 2017-09-30 MED ORDER — SODIUM CHLORIDE 0.9% FLUSH
3.0000 mL | Freq: Two times a day (BID) | INTRAVENOUS | Status: DC
  Administered 2017-09-30: 3 mL via INTRAVENOUS

## 2017-09-30 MED ORDER — SERTRALINE HCL 50 MG PO TABS: 50.0000 mg | ORAL_TABLET | Freq: Every day | ORAL | Status: DC

## 2017-09-30 MED ORDER — DOCUSATE SODIUM 100 MG PO CAPS
100.0000 mg | ORAL_CAPSULE | Freq: Two times a day (BID) | ORAL | Status: DC
  Administered 2017-09-30: 100 mg via ORAL
  Filled 2017-09-30: qty 1

## 2017-09-30 MED ORDER — PRESERVISION AREDS 2+MULTI VIT PO CAPS: ORAL_CAPSULE | Freq: Two times a day (BID) | ORAL | Status: DC

## 2017-09-30 MED ORDER — MIRTAZAPINE 15 MG PO TABS
30.0000 mg | ORAL_TABLET | Freq: Every day | ORAL | Status: DC
  Administered 2017-09-30: 10:00:00 30 mg via ORAL
  Filled 2017-09-30: qty 2

## 2017-09-30 MED ORDER — ACETAMINOPHEN 650 MG RE SUPP: 650.0000 mg | Freq: Four times a day (QID) | RECTAL | Status: DC | PRN

## 2017-09-30 MED ORDER — SERTRALINE HCL 50 MG PO TABS
50.0000 mg | ORAL_TABLET | Freq: Every day | ORAL | Status: DC
  Administered 2017-09-30: 10:00:00 50 mg via ORAL
  Filled 2017-09-30: qty 1

## 2017-09-30 MED ORDER — ALPRAZOLAM 0.25 MG PO TABS
0.2500 mg | ORAL_TABLET | Freq: Once | ORAL | Status: AC
  Administered 2017-09-30: 0.25 mg via ORAL
  Filled 2017-09-30: qty 1

## 2017-09-30 MED ORDER — LACTATED RINGERS IV SOLN
INTRAVENOUS | Status: DC
  Administered 2017-09-30: 03:00:00 via INTRAVENOUS

## 2017-09-30 MED ORDER — SODIUM CHLORIDE 0.9 % IV SOLN: 10.0000 mL/h | Freq: Once | INTRAVENOUS | Status: DC

## 2017-09-30 MED ORDER — GLIPIZIDE 5 MG PO TABS
5.0000 mg | ORAL_TABLET | Freq: Every day | ORAL | Status: DC
  Administered 2017-09-30: 10:00:00 5 mg via ORAL
  Filled 2017-09-30: qty 1

## 2017-09-30 MED ORDER — GLIPIZIDE 5 MG PO TABS: 5.0000 mg | ORAL_TABLET | Freq: Two times a day (BID) | ORAL | 0 refills | Status: DC

## 2017-09-30 MED ORDER — HYDROCODONE-ACETAMINOPHEN 5-325 MG PO TABS: 1.0000 | ORAL_TABLET | Freq: Four times a day (QID) | ORAL | 0 refills | Status: DC | PRN

## 2017-09-30 MED ORDER — SODIUM CHLORIDE 0.9% FLUSH: 3.0000 mL | INTRAVENOUS | Status: DC | PRN

## 2017-09-30 MED ORDER — POLYETHYLENE GLYCOL 3350 17 G PO PACK: 17.0000 g | PACK | Freq: Every day | ORAL | Status: DC | PRN

## 2017-09-30 MED ORDER — SODIUM CHLORIDE 0.9 % IV SOLN: 250.0000 mL | INTRAVENOUS | Status: DC | PRN

## 2017-09-30 MED ORDER — PANTOPRAZOLE SODIUM 40 MG PO TBEC
40.0000 mg | DELAYED_RELEASE_TABLET | Freq: Every day | ORAL | Status: DC
  Administered 2017-09-30: 40 mg via ORAL
  Filled 2017-09-30: qty 1

## 2017-09-30 MED ORDER — VITAMIN B-12 1000 MCG PO TABS
1000.0000 ug | ORAL_TABLET | Freq: Every day | ORAL | Status: DC
  Administered 2017-09-30: 1000 ug via ORAL
  Filled 2017-09-30: qty 1

## 2017-09-30 MED ORDER — ONDANSETRON HCL 4 MG PO TABS: 4.0000 mg | ORAL_TABLET | Freq: Four times a day (QID) | ORAL | Status: DC | PRN

## 2017-09-30 MED ORDER — ACETAMINOPHEN 325 MG PO TABS: 650.0000 mg | ORAL_TABLET | Freq: Four times a day (QID) | ORAL | Status: DC | PRN

## 2017-09-30 MED ORDER — OCUVITE-LUTEIN PO CAPS
1.0000 | ORAL_CAPSULE | Freq: Two times a day (BID) | ORAL | Status: DC
  Administered 2017-09-30: 1 via ORAL
  Filled 2017-09-30 (×2): qty 1

## 2017-09-30 MED ORDER — HYDROCODONE-ACETAMINOPHEN 5-325 MG PO TABS: 1.0000 | ORAL_TABLET | ORAL | Status: DC | PRN

## 2017-09-30 MED ORDER — OMEGA-3-ACID ETHYL ESTERS 1 G PO CAPS
1.0000 g | ORAL_CAPSULE | Freq: Every day | ORAL | Status: DC
  Administered 2017-09-30: 1 g via ORAL
  Filled 2017-09-30: qty 1

## 2017-09-30 MED ORDER — LISINOPRIL-HYDROCHLOROTHIAZIDE 20-12.5 MG PO TABS: 1.0000 | ORAL_TABLET | Freq: Every day | ORAL | Status: DC

## 2017-09-30 MED ORDER — INSULIN ASPART 100 UNIT/ML ~~LOC~~ SOLN
0.0000 [IU] | Freq: Three times a day (TID) | SUBCUTANEOUS | Status: DC
  Administered 2017-09-30 (×2): 15 [IU] via SUBCUTANEOUS
  Filled 2017-09-30 (×2): qty 1

## 2017-09-30 MED ORDER — FAMOTIDINE 20 MG IN NS 100 ML IVPB
20.0000 mg | Freq: Two times a day (BID) | INTRAVENOUS | Status: DC
  Administered 2017-09-30 (×2): 20 mg via INTRAVENOUS
  Filled 2017-09-30 (×2): qty 100

## 2017-09-30 NOTE — Consult Note (Signed)
Referring Physician:  No referring provider defined for this encounter.  Primary Physician:  Barbette ReichmannHande, Vishwanath, MD  Chief Complaint:  fall  History of Present Illness: Paul RiegerWilliam H Obrien is a 84 y.o. male who presents as a consult for subdural hematoma sustained after a mechanical fall in his bathroom 1 day ago. Denies LOC, headache, vision changes, weakness, speech changes.  Son was present in room and denies changes in mental status.  Review of Systems:  A 10 point review of systems is negative, except for the pertinent positives and negatives detailed in the HPI.  Past Medical History: Past Medical History:  Diagnosis Date  . Diabetes mellitus without complication (HCC)   . Hypertension     Past Surgical History: History reviewed. No pertinent surgical history.  Allergies: Allergies as of 09/29/2017 - Review Complete 09/29/2017  Allergen Reaction Noted  . Penicillins      Medications:  Current Facility-Administered Medications:  .  0.9 %  sodium chloride infusion, 250 mL, Intravenous, PRN, Salary, Montell D, MD .  acetaminophen (TYLENOL) tablet 650 mg, 650 mg, Oral, Q6H PRN **OR** acetaminophen (TYLENOL) suppository 650 mg, 650 mg, Rectal, Q6H PRN, Salary, Montell D, MD .  ALPRAZolam Prudy Feeler(XANAX) tablet 0.25 mg, 0.25 mg, Oral, Once, Enedina FinnerPatel, Sona, MD .  docusate sodium (COLACE) capsule 100 mg, 100 mg, Oral, BID, Salary, Montell D, MD, 100 mg at 09/30/17 0935 .  glipiZIDE (GLUCOTROL) tablet 5 mg, 5 mg, Oral, QAC breakfast, Salary, Montell D, MD, 5 mg at 09/30/17 0935 .  HYDROcodone-acetaminophen (NORCO/VICODIN) 5-325 MG per tablet 1-2 tablet, 1-2 tablet, Oral, Q4H PRN, Salary, Montell D, MD .  Melene Muller[START ON 10/01/2017] Influenza vac split quadrivalent PF (FLUZONE HIGH-DOSE) injection 0.5 mL, 0.5 mL, Intramuscular, Tomorrow-1000, Salary, Montell D, MD .  insulin aspart (novoLOG) injection 0-15 Units, 0-15 Units, Subcutaneous, TID WC, Salary, Montell D, MD, 15 Units at 09/30/17 0819 .   insulin aspart (novoLOG) injection 0-5 Units, 0-5 Units, Subcutaneous, QHS, Salary, Montell D, MD .  lactated ringers infusion, , Intravenous, Continuous, Salary, Montell D, MD, Last Rate: 75 mL/hr at 09/30/17 0241 .  lisinopril (PRINIVIL,ZESTRIL) tablet 40 mg, 40 mg, Oral, Daily, Salary, Montell D, MD, 40 mg at 09/30/17 0935 .  mirtazapine (REMERON) tablet 30 mg, 30 mg, Oral, Daily, Salary, Montell D, MD, 30 mg at 09/30/17 0935 .  multivitamin-lutein (OCUVITE-LUTEIN) capsule 1 capsule, 1 capsule, Oral, BID, Salary, Montell D, MD .  niacin (NIASPAN) CR tablet 500 mg, 500 mg, Oral, QHS, Salary, Montell D, MD .  omega-3 acid ethyl esters (LOVAZA) capsule 1 g, 1 g, Oral, Daily, Salary, Montell D, MD, 1 g at 09/30/17 0935 .  ondansetron (ZOFRAN) tablet 4 mg, 4 mg, Oral, Q6H PRN **OR** ondansetron (ZOFRAN) injection 4 mg, 4 mg, Intravenous, Q6H PRN, Salary, Montell D, MD .  pantoprazole (PROTONIX) EC tablet 40 mg, 40 mg, Oral, Daily, Salary, Montell D, MD, 40 mg at 09/30/17 0935 .  polyethylene glycol (MIRALAX / GLYCOLAX) packet 17 g, 17 g, Oral, Daily PRN, Salary, Montell D, MD .  sertraline (ZOLOFT) tablet 50 mg, 50 mg, Oral, Daily, Salary, Montell D, MD, 50 mg at 09/30/17 0935 .  sertraline (ZOLOFT) tablet 50 mg, 50 mg, Oral, Daily, Allena KatzPatel, Sona, MD .  sodium chloride flush (NS) 0.9 % injection 3 mL, 3 mL, Intravenous, Q12H, Salary, Montell D, MD, 3 mL at 09/30/17 0250 .  sodium chloride flush (NS) 0.9 % injection 3 mL, 3 mL, Intravenous, PRN, Salary, Evelena AsaMontell D, MD .  vitamin  B-12 (CYANOCOBALAMIN) tablet 1,000 mcg, 1,000 mcg, Oral, Daily, Salary, Montell D, MD, 1,000 mcg at 09/30/17 0935   Social History: Social History   Tobacco Use  . Smoking status: Former Games developer  . Smokeless tobacco: Never Used  Substance Use Topics  . Alcohol use: No    Frequency: Never  . Drug use: No    Family Medical History: History reviewed. No pertinent family history.  Physical Examination: Vitals:    09/30/17 0216 09/30/17 0255  BP: 120/68   Pulse: 86   Resp: 16   Temp: 98.3 F (36.8 C)   SpO2: (!) 89% 95%     General: Patient is well developed, well nourished, calm, collected, and in no apparent distress.   Psychiatric: Patient is non-anxious.  Head:  Pupils equal, round, and reactive to light.  ENT:  Oral mucosa appears well hydrated.  Neck:   Supple.  Full range of motion.  Respiratory: Patient is breathing without any difficulty.  Skin:    Small, healing skin tear right eyelid. Left foot immobilized.   NEUROLOGICAL:  General: In no acute distress.   Awake, alert, oriented to person, place, and time.  Pupils equal round and reactive to light.  Facial tone is symmetric.  Tongue protrusion is midline.  There is no pronator drift.  Strength: Side Biceps Triceps Deltoid Interossei Grip Wrist Ext. Wrist Flex.  R 5 5 5 5 5 5 5   L 5 5 5 5 5 5 5    Side Iliopsoas Quads Hamstring PF DF EHL  R 5 5 5 5 5 5   L 5 5 5 5 5 5    Bilateral upper and lower extremity sensation is intact to light touch and pin prick.    Imaging: EXAM: CT HEAD WITHOUT CONTRAST  TECHNIQUE: Contiguous axial images were obtained from the base of the skull through the vertex without intravenous contrast.  COMPARISON:  September 29, 2017  FINDINGS: Brain: Mild diffuse atrophy is stable. The previously noted anterior falcine subdural hematoma is again noted with a maximum thickness in this area of 4 mm, stable. Minimal right frontal convexity subdural hematoma measuring just over 2 mm in thickness is stable. There has been no new or progression of extra-axial hemorrhage. There is no appreciable intra-axial hemorrhage. There is no mass. There is no midline shift.  There is small vessel disease in the centra semiovale bilaterally. There are prior lacunar infarcts in the posterior limb of the right internal capsule. Small vessel disease is noted throughout the internal and external capsules.  There is evidence of a prior infarct in the medial left occipital lobe. There is evidence of a prior infarct in the midportion of the posterior left cerebellum with a much smaller nearby lacunar infarct in this area. There is a small lacunar infarct in the inferior right cerebellar hemisphere, stable. There is no new gray-white compartment lesions suggesting acute infarct.  Vascular: There is no appreciable hyperdense vessel. There is calcification in each carotid siphon and distal vertebral artery region.  Skull: The bony calvarium appears intact.  Sinuses/Orbits: There is mucosal thickening in several ethmoid air cells. Other visualized paranasal sinuses are clear. Orbits appear symmetric bilaterally.  Other: Mastoid air cells are clear on the right. There is mild opacification in several inferior mastoid air cells on the left, stable.  IMPRESSION: No change from 1 day prior. Small anterior falcine subdural hematoma with rather minimal medial anterior right frontal subdural hematoma without appreciable mass effect. No midline shift. No new or progressing  hemorrhage intra axially or extra axially. No mass.  Stable prior infarcts and small vessel disease without acute infarct evident. Stable atrophy.  Multiple foci of arterial vascular calcification noted. Mucosal thickening in several ethmoid air cells noted. Mild inferior left mastoid disease, stable.   EXAM: CT CERVICAL SPINE WITHOUT CONTRAST  TECHNIQUE: Multidetector CT imaging of the cervical spine was performed without intravenous contrast. Multiplanar CT image reconstructions were also generated.  COMPARISON:  CT cervical spine 09/06/2013  FINDINGS: Alignment: Normal.  Skull base and vertebrae: No acute fracture. Vertebral body heights are maintained. The dens and skull base are intact.  Soft tissues and spinal canal: No prevertebral fluid or swelling. No visible canal hematoma.  Disc levels:  Unchanged diffuse disc space narrowing and endplate spurring. Multilevel facet arthropathy. Multilevel neural foraminal stenosis and canal stenosis at C6-C7, unchanged from prior.  Upper chest: Negative.  Other: Carotid calcifications. 19 mm left thyroid nodule is new from prior CT.  IMPRESSION: 1. Unchanged multilevel degenerative disc disease and facet arthropathy in the cervical spine without acute fracture. 2. New 19 mm left thyroid nodule from 2015. Recommend elective outpatient thyroid ultrasound for characterization.   EXAM: CT HEAD WITHOUT CONTRAST  TECHNIQUE: Contiguous axial images were obtained from the base of the skull through the vertex without intravenous contrast.  COMPARISON:  CT 09/06/2013, MRI brain 09/07/2013  FINDINGS: Brain: No acute territorial infarction or intracranial mass is visualized. Acute right anterior parafalcine subdural hematoma measuring 4 mm in maximum thickness with thin right frontal convexity subdural hematoma measuring 2 mm. No midline shift. No mass effect.  Moderate atrophy. Moderate small vessel ischemic changes of the white matter. Old lacunar infarcts in the right basal ganglia. 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  Vascular: No hyperdense vessels. Carotid vascular calcification and vertebral calcification  Skull: No depressed skull fracture  Sinuses/Orbits: No acute finding.  Other: None  IMPRESSION: 1. Acute right anterior parafalcine subdural hematoma with thin right frontal convexity subdural hematoma. No midline shift. No significant mass effect 2. Atrophy with small vessel ischemic changes of the white matter and old left cerebellar and occipital infarcts.   Assessment and Plan: Mr. Feldhaus is a pleasant 84 y.o. male with a subdural hematoma after sustaining a mechanical fall 1 day ago. Repeat head CT performed this morning revealed stabilization of hematoma. Patient has no complaints at this  time. Neuro exam unremarkable for deficits. Surgical intervention not necessary at this time. Keppra not necessary at this time.  Recommend: PT eval. Continue to hold blood thinners for 7 days unless medically necessary.    Ivar Drape, PA-C Dept. of Neurosurgery

## 2017-09-30 NOTE — Clinical Social Work Note (Signed)
Clinical Social Work Assessment  Patient Details  Name: Alice RiegerWilliam H Wardrop MRN: [id number] Date of Birth: Sep 05, 1929  Date of referral:  09/30/17               Reason for consult:  Other (Comment Required)(From [facility] )                Permission sought to share information with:  Facility Industrial/product designerContact Representative Permission granted to share information::  Yes, Verbal Permission Granted  Name::      [facility]   Agency::     Relationship::     Contact Information:     Housing/Transportation Living arrangements for the past 2 months:  Assisted Living Facility Source of Information:  Patient, Adult Children, Facility Patient Interpreter Needed:  None Criminal Activity/Legal Involvement Pertinent to Current Situation/Hospitalization:  No - Comment as needed Significant Relationships:  Adult Children Lives with:  Facility Resident Do you feel safe going back to the place where you live?  Yes Need for family participation in patient care:  Yes (Comment)  Care giving concerns:  [facility] (fax: [phone]).    Social Worker assessment / plan:  Visual merchandiserClinical Social Worker (CSW) reviewed chart and noted that [facility] is from [facility] and is stable for D/C today. [facility] and made her aware of above. Per Kendal HymenBonnie patient's wife passed away yesterday and the same day patient fell and [facility]. Per Kendal [facility]Bonnie [facility] and does not require assistance with ambulation at baseline. Per Kendal HymenBonnie patient can return today and [facility] will pick up patient in their wheel chair van at 2:30 pm. CSW made patient aware of above. CSW contacted patient's son Freida Busmanllen and made him aware of above. Son is in agreement with D/C plan. RN will call report. CSW sent D/C summary and FL2 to [facility]. Please reconsult if future social work needs arise. CSW signing off.   Employment status:  Disabled (Comment on  whether or not currently receiving Disability), Retired Database administratornsurance information:  Managed Medicare PT Recommendations:  Not assessed at this time Information / Referral to community resources:  Other (Comment Required)(Patient will D/C back to ALF. )  Patient/Family's Response to care:  Patient and his son are agreeable for patient to return to [facility] today.   Patient/Family's Understanding of and Emotional Response to Diagnosis, Current Treatment, and Prognosis:  Patient and his son were very pleasant and thanked CSW for assistance.   Emotional Assessment Appearance:  Appears stated age Attitude/Demeanor/Rapport:    Affect (typically observed):  Accepting, Quiet Orientation:  Oriented to Self, Oriented to Place, Fluctuating Orientation (Suspected and/or reported Sundowners) Alcohol / Substance use:  Not Applicable Psych involvement (Current and /or in the community):  No (Comment)  Discharge Needs  Concerns to be addressed:  Discharge Planning Concerns Readmission within the last 30 days:  No Current discharge risk:  Chronically ill Barriers to Discharge:  No Barriers Identified   Tynisa Vohs, Darleen CrockerBailey M, LCSW 09/30/2017, 2:31 PM

## 2017-09-30 NOTE — [place] FL2 (Signed)
[place] MEDICAID FL2 LEVEL OF CARE SCREENING TOOL     IDENTIFICATION  Patient Name: Paul Martinez Birthdate: [birth year] Sex: male Admission Date (Current Location): 09/29/2017  [facility] and [place] Number:  Chiropodist and Address:  [facility], [address] [place], [place] [address]      Provider Number: [id number]  Attending Physician Name and Address:  Enedina Finner, MD  Relative Name and Phone Number:       Current Level of Care: Hospital Recommended Level of Care: Assisted Living Facility Prior Approval Number:    Date Approved/Denied:   PASRR Number:    Discharge Plan: Domiciliary (Rest home)    Current Diagnoses: Patient Active Problem List   Diagnosis Date Noted  . ICH (intracerebral hemorrhage) (HCC) 09/30/2017  . DIZZINESS 02/23/2010  . DYSPNEA 02/23/2010  . ABNORMAL ELECTROCARDIOGRAM 02/23/2010    Orientation RESPIRATION BLADDER Height & Weight     Self, Place  Normal Continent Weight: 213 lb 1.6 oz (96.7 kg) Height:  5\' 11"  (180.3 cm)  BEHAVIORAL SYMPTOMS/MOOD NEUROLOGICAL BOWEL NUTRITION STATUS      Continent Diet(Diet: Low Sodium/ Heart Healthy )  AMBULATORY STATUS COMMUNICATION OF NEEDS Skin   Limited Assist Verbally Normal                       Personal Care Assistance Level of Assistance  Bathing, Feeding, Dressing Bathing Assistance: Limited assistance Feeding assistance: Independent Dressing Assistance: Limited assistance     Functional Limitations Info  Sight, Hearing, Speech Sight Info: Impaired Hearing Info: Impaired Speech Info: Adequate    SPECIAL CARE FACTORS FREQUENCY  PT (By licensed PT)     PT Frequency: (2-3 home health )              Contractures      Additional Factors Info  Code Status, Allergies Code Status Info: (Full Code. ) Allergies Info: (Penicillins)          Discharge Medications: Please see discharge summary for a list of discharge  medications. Medication List     STOP taking these medications   aspirin 81 MG EC tablet   clopidogrel 75 MG tablet Commonly known as:  PLAVIX     TAKE these medications   acetaminophen 325 MG tablet Commonly known as:  TYLENOL Take 650 mg by mouth every 6 (six) hours as needed.   Fish Oil 1000 MG Caps Take 1 capsule by mouth daily.   glipiZIDE 5 MG tablet Commonly known as:  GLUCOTROL Take 1 tablet (5 mg total) by mouth 2 (two) times daily before a meal. What changed:  when to take this   HYDROcodone-acetaminophen 5-325 MG tablet Commonly known as:  NORCO/VICODIN Take 1-2 tablets by mouth every 6 (six) hours as needed for moderate pain.   lisinopril 40 MG tablet Commonly known as:  PRINIVIL,ZESTRIL Take 40 mg by mouth daily.   mirtazapine 30 MG tablet Commonly known as:  REMERON Take 30 mg by mouth daily.   niacin 500 MG CR tablet Commonly known as:  NIASPAN Take 500 mg by mouth at bedtime.   pantoprazole 40 MG tablet Commonly known as:  PROTONIX Take 40 mg by mouth daily.   PRESERVISION AREDS 2+MULTI VIT PO Take 1 tablet by mouth 2 (two) times daily.   pseudoephedrine-guaifenesin 60-600 MG 12 hr tablet Commonly known as:  MUCINEX D Take 1 tablet by mouth every 12 (twelve) hours.   ranitidine 150 MG capsule Commonly known as:  ZANTAC Take 150 mg by mouth 2 (two) times daily.   sertraline 50 MG tablet Commonly known as:  ZOLOFT Take 50 mg by mouth daily.   vitamin B-12 1000 MCG tablet Commonly known as:  CYANOCOBALAMIN Take 1,000 mcg by mouth daily.   Relevant Imaging Results: Relevant Lab Results: Additional Information (SSN: [id number])  Lindwood Mogel, Darleen CrockerBailey M, LCSW

## 2017-09-30 NOTE — Discharge Summary (Signed)
[facility] [place] [facility]   PATIENT NAME: Paul Martinez    MR#:  [id number]  DATE OF BIRTH:  Aug 15, 1929  DATE OF ADMISSION:  09/29/2017 ADMITTING PHYSICIAN: Bertrum Sol, MD  DATE OF DISCHARGE: 09/30/2017  PRIMARY CARE PHYSICIAN: Barbette Reichmann, MD    ADMISSION DIAGNOSIS:  Pain [R52] Contusion of left knee, initial encounter [S80.02XA] Contusion of right elbow and forearm, initial encounter [S50.11XA] Subdural hematoma without coma, without loss of consciousness, initial encounter (HCC) [S06.5X0A] Displaced fracture of fifth metatarsal bone, left foot, initial encounter for closed fracture [S92.352A]  DISCHARGE DIAGNOSIS:  *Acute right anterior parafalcine subdural hematoma with thin right frontal convexity subdural hematoma--status post mechanical fall--patient is off aspirin and Plavix for 1 week *Left fifth metatarsal nondisplaced fracture--- cast applied  SECONDARY DIAGNOSIS:   Past Medical History:  Diagnosis Date  . Diabetes mellitus without complication (HCC)   . Hypertension     HOSPITAL COURSE:  Paul Martinez  is a 84 y.o. male with a known history per below which also includes anxiety, depression, chronic kidney disease stage III, dementia, hard of hearing, presents status post mechanical fall at home  in the emergency room workup noted for acute right parafalcine and right frontal subdural hematomas  1.  Acute subdural hematoma right anterior parafalcine and right frontal rest post mechanical fall at home.  Patient reports bathroom at the assisted living facility was wet and he slipped and fell. -Hold aspirin Plavix for 1 week per neurosurgery Dr. Adriana Simas -Patient neurologically intact -Work with physical therapy recommends home health PT -Repeat CT head is stable -Patient will follow with Dr. Adriana Simas in 2 weeks -Patient will need CBC done in a week with PCPs office since platelet count was 89,000 -no Active bleed  2.  Left  fifth metatarsal fracture -Has a cast -Ambulated with PT.  Postop wedge boot prescribed -Follow-up with Dr. Joice Lofts who is on for unassigned orthopedic as a follow-up outpatient  3.  Hypertension continue home meds  4.  Diabetes uncontrolled -Increase glipizide to 5 twice daily Continue sliding scale insulin Patient will follow up sugars with Dr. Marcello Fennel as outpatient  Spoke with patient's son in the room. Patient's wife was under hospice care passed away yesterday.  Patient is a bit tearful requesting to be with family and wife during this time.  Since patient is overall doing well we will discharge him today with outpatient follow-up with primary care and neurosurgery.  CONSULTS OBTAINED:  Treatment Team:  Lucy Chris, MD  DRUG ALLERGIES:   Allergies  Allergen Reactions  . Penicillins     nka is listed on MAR    DISCHARGE MEDICATIONS:   Allergies as of 09/30/2017      Reactions   Penicillins    nka is listed on MAR      Medication List    STOP taking these medications   aspirin 81 MG EC tablet   clopidogrel 75 MG tablet Commonly known as:  PLAVIX     TAKE these medications   acetaminophen 325 MG tablet Commonly known as:  TYLENOL Take 650 mg by mouth every 6 (six) hours as needed.   Fish Oil 1000 MG Caps Take 1 capsule by mouth daily.   glipiZIDE 5 MG tablet Commonly known as:  GLUCOTROL Take 1 tablet (5 mg total) by mouth 2 (two) times daily before a meal. What changed:  when to take this   HYDROcodone-acetaminophen 5-325 MG tablet Commonly known as:  NORCO/VICODIN Take  1-2 tablets by mouth every 6 (six) hours as needed for moderate pain.   lisinopril 40 MG tablet Commonly known as:  PRINIVIL,ZESTRIL Take 40 mg by mouth daily.   mirtazapine 30 MG tablet Commonly known as:  REMERON Take 30 mg by mouth daily.   niacin 500 MG CR tablet Commonly known as:  NIASPAN Take 500 mg by mouth at bedtime.   pantoprazole 40 MG tablet Commonly known as:   PROTONIX Take 40 mg by mouth daily.   PRESERVISION AREDS 2+MULTI VIT PO Take 1 tablet by mouth 2 (two) times daily.   pseudoephedrine-guaifenesin 60-600 MG 12 hr tablet Commonly known as:  MUCINEX D Take 1 tablet by mouth every 12 (twelve) hours.   ranitidine 150 MG capsule Commonly known as:  ZANTAC Take 150 mg by mouth 2 (two) times daily.   sertraline 50 MG tablet Commonly known as:  ZOLOFT Take 50 mg by mouth daily.   vitamin B-12 1000 MCG tablet Commonly known as:  CYANOCOBALAMIN Take 1,000 mcg by mouth daily.       If you experience worsening of your admission symptoms, develop shortness of breath, life threatening emergency, suicidal or homicidal thoughts you must seek medical attention immediately by calling 911 or calling your MD immediately  if symptoms less severe.  You Must read complete instructions/literature along with all the possible adverse reactions/side effects for all the Medicines you take and that have been prescribed to you. Take any new Medicines after you have completely understood and accept all the possible adverse reactions/side effects.   Please note  You were cared for by a hospitalist during your hospital stay. If you have any questions about your discharge medications or the care you received while you were in the hospital after you are discharged, you can call the unit and asked to speak with the hospitalist on call if the hospitalist that took care of you is not available. Once you are discharged, your primary care physician will handle any further medical issues. Please note that NO REFILLS for any discharge medications will be authorized once you are discharged, as it is imperative that you return to your primary care physician (or establish a relationship with a primary care physician if you do not have one) for your aftercare needs so that they can reassess your need for medications and monitor your lab values. Today   SUBJECTIVE   Denies any  complaints.  Patient is tearful since his wife passed away yesterday No focal weakness  VITAL SIGNS:  Blood pressure 125/67, pulse (!) 58, temperature 97.8 F (36.6 C), temperature source Oral, resp. rate 16, height 5\' 11"  (1.803 m), weight 96.7 kg (213 lb 1.6 oz), SpO2 97 %.  I/O:    Intake/Output Summary (Last 24 hours) at 09/30/2017 1215 Last data filed at 09/30/2017 0900 Gross per 24 hour  Intake 660.5 ml  Output 100 ml  Net 560.5 ml    PHYSICAL EXAMINATION:  GENERAL:  84 y.o.-year-old patient lying in the bed with no acute distress.  EYES: Pupils equal, round, reactive to light and accommodation. No scleral icterus. Extraocular muscles intact.  HEENT: Head atraumatic, normocephalic. Oropharynx and nasopharynx clear.  NECK:  Supple, no jugular venous distention. No thyroid enlargement, no tenderness.  LUNGS: Normal breath sounds bilaterally, no wheezing, rales,rhonchi or crepitation. No use of accessory muscles of respiration.  CARDIOVASCULAR: S1, S2 normal. No murmurs, rubs, or gallops.  ABDOMEN: Soft, non-tender, non-distended. Bowel sounds present. No organomegaly or mass.  EXTREMITIES: No  pedal edema, cyanosis, or clubbing.  Left leg cast+ NEUROLOGIC: Cranial nerves II through XII are intact. Muscle strength 5/5 in all extremities. Sensation intact. Gait not checked.  PSYCHIATRIC:  patient is alert and oriented x 3.  SKIN: No obvious rash, lesion, or ulcer.   DATA REVIEW:   CBC  Recent Labs  Lab 09/29/17 2349  WBC 5.5  HGB 14.4  HCT 43.4  PLT 89*    Chemistries  Recent Labs  Lab 09/29/17 2349  NA 137  K 4.0  CL 104  CO2 25  GLUCOSE 325*  BUN 14  CREATININE 1.35*  CALCIUM 9.2    Microbiology Results   Recent Results (from the past 240 hour(s))  MRSA PCR Screening     Status: None   Collection Time: 09/30/17  3:33 AM  Result Value Ref Range Status   MRSA by PCR NEGATIVE NEGATIVE Final    Comment:        The GeneXpert MRSA Assay (FDA approved for  NASAL specimens only), is one component of a comprehensive MRSA colonization surveillance program. It is not intended to diagnose MRSA infection nor to guide or monitor treatment for MRSA infections. Performed at [facility], [address] [place], [place] [address]     RADIOLOGY:  Dg Elbow 2 Views Right  Result Date: 09/29/2017 CLINICAL DATA:  Fall, pain to right elbow. EXAM: RIGHT ELBOW - 2 VIEW COMPARISON:  None. FINDINGS: Osseous alignment is normal. No fracture line or displaced fracture fragment identified. No appreciable joint effusion and adjacent soft tissues are unremarkable. Mild degenerative spurring at the radial head. IMPRESSION: No acute findings. No osseous fracture or dislocation. Mild degenerative spurring. Electronically Signed   By: Bary Richard M.D.   On: 09/29/2017 22:11   Dg Knee 2 Views Left  Result Date: 09/29/2017 CLINICAL DATA:  Slip on tile floor in the bathroom.  Left knee pain. EXAM: LEFT KNEE - 1-2 VIEW COMPARISON:  None. FINDINGS: No acute fracture or dislocation. Tricompartmental osteoarthritis, most prominent in the medial tibiofemoral compartment with joint space narrowing and peripheral spurring. Trace joint effusion. Possible ossified body within a Baker cyst posteriorly. There are vascular calcifications. IMPRESSION: 1. No fracture or dislocation of the left knee. 2. Moderate osteoarthritis. Electronically Signed   By: Rubye Oaks M.D.   On: 09/29/2017 23:24   Ct Head Wo Contrast  Result Date: 09/30/2017 CLINICAL DATA:  Recent subdural hematoma EXAM: CT HEAD WITHOUT CONTRAST TECHNIQUE: Contiguous axial images were obtained from the base of the skull through the vertex without intravenous contrast. COMPARISON:  September 29, 2017 FINDINGS: Brain: Mild diffuse atrophy is stable. The previously noted anterior falcine subdural hematoma is again noted with a maximum thickness in this area of 4 mm, stable. Minimal right frontal convexity  subdural hematoma measuring just over 2 mm in thickness is stable. There has been no new or progression of extra-axial hemorrhage. There is no appreciable intra-axial hemorrhage. There is no mass. There is no midline shift. There is small vessel disease in the centra semiovale bilaterally. There are prior lacunar infarcts in the posterior limb of the right internal capsule. Small vessel disease is noted throughout the internal and external capsules. There is evidence of a prior infarct in the medial left occipital lobe. There is evidence of a prior infarct in the midportion of the posterior left cerebellum with a much smaller nearby lacunar infarct in this area. There is a small lacunar infarct in the inferior right cerebellar hemisphere, stable. There  is no new gray-white compartment lesions suggesting acute infarct. Vascular: There is no appreciable hyperdense vessel. There is calcification in each carotid siphon and distal vertebral artery region. Skull: The bony calvarium appears intact. Sinuses/Orbits: There is mucosal thickening in several ethmoid air cells. Other visualized paranasal sinuses are clear. Orbits appear symmetric bilaterally. Other: Mastoid air cells are clear on the right. There is mild opacification in several inferior mastoid air cells on the left, stable. IMPRESSION: No change from 1 day prior. Small anterior falcine subdural hematoma with rather minimal medial anterior right frontal subdural hematoma without appreciable mass effect. No midline shift. No new or progressing hemorrhage intra axially or extra axially. No mass. Stable prior infarcts and small vessel disease without acute infarct evident. Stable atrophy. Multiple foci of arterial vascular calcification noted. Mucosal thickening in several ethmoid air cells noted. Mild inferior left mastoid disease, stable. Electronically Signed   By: Bretta Bang III M.D.   On: 09/30/2017 07:24   Ct Head Wo Contrast  Result Date:  09/29/2017 CLINICAL DATA:  Larey Seat in bathroom on hit posterior skull on toilet EXAM: CT HEAD WITHOUT CONTRAST TECHNIQUE: Contiguous axial images were obtained from the base of the skull through the vertex without intravenous contrast. COMPARISON:  CT 09/06/2013, MRI brain 09/07/2013 FINDINGS: Brain: No acute territorial infarction or intracranial mass is visualized. Acute right anterior parafalcine subdural hematoma measuring 4 mm in maximum thickness with thin right frontal convexity subdural hematoma measuring 2 mm. No midline shift. No mass effect. Moderate atrophy. Moderate small vessel ischemic changes of the white matter. Old lacunar infarcts in the right basal ganglia. 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 Vascular: No hyperdense vessels. Carotid vascular calcification and vertebral calcification Skull: No depressed skull fracture Sinuses/Orbits: No acute finding. Other: None IMPRESSION: 1. Acute right anterior parafalcine subdural hematoma with thin right frontal convexity subdural hematoma. No midline shift. No significant mass effect 2. Atrophy with small vessel ischemic changes of the white matter and old left cerebellar and occipital infarcts. Critical Value/emergent results were called by telephone at the time of interpretation on 09/29/2017 at 10:28 pm to Dr. Nona Dell , who verbally acknowledged these results. Electronically Signed   By: Jasmine Pang M.D.   On: 09/29/2017 22:28   Ct Cervical Spine Wo Contrast  Result Date: 09/30/2017 CLINICAL DATA:  Status post fall today.  Known subdural hematoma. EXAM: CT CERVICAL SPINE WITHOUT CONTRAST TECHNIQUE: Multidetector CT imaging of the cervical spine was performed without intravenous contrast. Multiplanar CT image reconstructions were also generated. COMPARISON:  CT cervical spine 09/06/2013 FINDINGS: Alignment: Normal. Skull base and vertebrae: No acute fracture. Vertebral body heights are maintained. The dens and skull base are intact. Soft  tissues and spinal canal: No prevertebral fluid or swelling. No visible canal hematoma. Disc levels: Unchanged diffuse disc space narrowing and endplate spurring. Multilevel facet arthropathy. Multilevel neural foraminal stenosis and canal stenosis at C6-C7, unchanged from prior. Upper chest: Negative. Other: Carotid calcifications. 19 mm left thyroid nodule is new from prior CT. IMPRESSION: 1. Unchanged multilevel degenerative disc disease and facet arthropathy in the cervical spine without acute fracture. 2. New 19 mm left thyroid nodule from 2015. Recommend elective outpatient thyroid ultrasound for characterization. Electronically Signed   By: Rubye Oaks M.D.   On: 09/30/2017 00:19   Dg Foot Complete Left  Result Date: 09/29/2017 CLINICAL DATA:  Slip and fall on bathroom floor with left foot pain. EXAM: LEFT FOOT - COMPLETE 3+ VIEW COMPARISON:  None. FINDINGS: Oblique mildly  displaced and minimally comminuted fifth metatarsal fracture. No intra-articular extension. No additional acute fracture of the foot. There are hammertoe deformity of the digits. IMPRESSION: Oblique mildly displaced fifth metatarsal fracture without intra-articular extension. Electronically Signed   By: Rubye Oaks M.D.   On: 09/29/2017 22:12     Management plans discussed with the patient, family and they are in agreement.  CODE STATUS:     Code Status Orders  (From admission, onward)        Start     Ordered   09/30/17 0216  Full code  Continuous     09/30/17 0215    Code Status History    Date Active Date Inactive Code Status Order ID Comments User Context   This patient has a current code status but no historical code status.    Advance Directive Documentation     Most Recent Value  Type of Advance Directive  Healthcare Power of Attorney, Living will  Pre-existing out of facility DNR order (yellow form or pink MOST form)  No data  "MOST" Form in Place?  No data      TOTAL TIME TAKING CARE OF  THIS PATIENT: *40* minutes.    Enedina Finner M.D on 09/30/2017 at 12:15 PM  Between 7am to 6pm - Pager - [phone] After 6pm go to [email or web address] - Social research officer, government  [facility]  Office  [phone]  CC: Primary care physician; Barbette Reichmann, MD

## 2017-09-30 NOTE — ED Provider Notes (Signed)
-----------------------------------------   12:46 AM on 09/30/2017 -----------------------------------------   Blood pressure (!) 142/65, pulse 72, temperature 98.1 F (36.7 C), temperature source Oral, resp. rate 18, height 5\' 11"  (1.803 m), weight 90.7 kg (200 lb), SpO2 94 %.  Assuming care from [place] O'Lakesonald Smith PA-C.  In short, Paul Martinez is a 84 y.o. male with a chief complaint of Fall .  Refer to the original H&Martinez for additional details.  During the evaluation of the patient the patient was found to have a small 4 mm right parafalcine subdural hematoma as well as a left fifth metatarsal fracture.  The patient is neurologically intact and his vital signs are unremarkable.  We did order some blood work on the patient and I did place [facility].  The patient's blood work was significant for a platelet count of 89,000 and a glucose of 325.  The patient's PT/INR and troponin were unremarkable.  I spoke with Dr. Sherryll BurgerShah the attending in the neurosurgical intensive care unit.  He felt that given the information that the patient did not need any neurosurgical intervention at this time.  The patient has low platelets and is also on Plavix so he does recommend transfusing the patient with some platelets but he recommends repeating the CT scan as well since the patient is neurologically intact.  He does not feel that the patient needs to be [facility] and they do not have any available beds at this time.  I will admit the patient to our hospital and have his CT scan repeated.  I discussed this with the patient as well as his son.  They do not have any concerns or any questions at this time.  The patient will receive 1 unit of platelets which has been ordered and he will be admitted for observation overnight.        Rebecka ApleyWebster, Paul Bos P, MD 09/30/17 [phone]

## 2017-09-30 NOTE — Discharge Instructions (Signed)
Patient to get CBC done in 1 week at primary care physician's office check platelet count

## 2017-09-30 NOTE — Progress Notes (Signed)
[facility] Report called to [place] at facility. Facility will transport at 1430.

## 2017-09-30 NOTE — ED Notes (Signed)
[facility].

## 2017-09-30 NOTE — Evaluation (Signed)
Physical Therapy Evaluation Patient Details Name: Paul Martinez MRN: [id number] DOB: [birth year] Today's Date: 09/30/2017   History of Present Illness  presented to ER secondary to mechanical fall, hitting head; admitted with acute R parafalcine, R frontal SDH. Also noted with incidental thyroid nodule; recommended for outpatient follow up.  Clinical Impression  Upon evaluation, patient alert and oriented to basic information; follows all commands, but demonstrates noted deficits in STM (difficulty with recall of new information, retelling similar story multiple times during session).  Bilat UE/LE strength and ROM grossly symmetrical and WFL; no focal weakness, sensory or coordination deficits noted.  Demonstrates ability to complete sit/stand, basic transfers and gait (160') with RW, L post-op shoe, cga/min assist for optimal safety.  Encouraged for 3-point, step to gait pattern due to limited mobility of L ankle/foot. Patient voiced understanding; improved sequencing as gait distance improved.  Higher-level balance deficits evident; do recommend continued use of RW for optimal safety/indep with functional activities. Would benefit from skilled PT to address above deficits and promote optimal return to PLOF; Recommend transition to HHPT upon discharge from acute hospitalization.  Of note, patient reporting mild rubbing/irritation over 'instep' of L LE with mobility. Information relayed to attending physician and charge RN on unit to ensure inspection prior to discharge for assessment of skin integrity/positioning.      Follow Up Recommendations Home health PT    Equipment Recommendations  (has access to RW at discharge)    Recommendations for Other Services       Precautions / Restrictions Precautions Precautions: Fall Required Braces or Orthoses: Other Brace/Splint Other Brace/Splint: L LE post-op shoe with WBing Restrictions Weight Bearing Restrictions: Yes LLE Weight Bearing:  Weight bearing as tolerated      Mobility  Bed Mobility               General bed mobility comments: standing edge of bed with nursing beginning of session; seated in recliner end of session  Transfers Overall transfer level: Needs assistance Equipment used: Rolling walker (2 wheeled) Transfers: Sit to/from Stand Sit to Stand: Min guard         General transfer comment: min cuing for hand placement, overall mechanics to maximize effectiveness of transfer  Ambulation/Gait Ambulation/Gait assistance: Min guard Ambulation Distance (Feet): 160 Feet Assistive device: Rolling walker (2 wheeled)   Gait velocity: 10' walk time, 8-9 seconds Gait velocity interpretation: <1.8 ft/sec, indicative of risk for recurrent falls General Gait Details: educated in 3-point stepping pattern (due to L LE post-op shoe) and use of RW for optimal stability. Mildly antalgic due to L post-op shoe; encouraged R footwear with all mobiltiy for symmetrical weight shift and alignment/LLD. Patient/son voiced understanding and agreement.  Stairs            Wheelchair Mobility    Modified Rankin (Stroke Patients Only)       Balance Overall balance assessment: Needs assistance Sitting-balance support: No upper extremity supported;Feet supported Sitting balance-Leahy Scale: Good     Standing balance support: No upper extremity supported Standing balance-Leahy Scale: Fair Standing balance comment: intermittently reaching for furniture/bedrails for external stabilization                             Pertinent Vitals/Pain Pain Assessment: No/denies pain    Home Living Family/patient expects to be discharged to:: ([facility])  Prior Function Level of Independence: Independent         Comments: Indep with ADLs, household distances; ambulates to/from dining hall for all meals; no home O2; no additional fall history.     Hand Dominance    Dominant Hand: Right    Extremity/Trunk Assessment   Upper Extremity Assessment Upper Extremity Assessment: Overall WFL for tasks assessed    Lower Extremity Assessment Lower Extremity Assessment: Overall WFL for tasks assessed(grossly at least 4+/5 (L ankle/foot not tested); no focal weakness, sensory or coordination deficit appreciated)       Communication   Communication: HOH  Cognition Arousal/Alertness: Awake/alert Behavior During Therapy: WFL for tasks assessed/performed Overall Cognitive Status: History of cognitive impairments - at baseline                                 General Comments: noted deficits in STM (patient re-telling similar story multiple times)      General Comments      Exercises Other Exercises Other Exercises: Educated on donning/doffing and wear schedule of L post-op shoe; min assist don properly.  Instructed in need for shoe to R LE during mobility efforts for symmetry and equal LLD. Other Exercises: Sit/stand x7 with RW, cga/min assist-emphasis on hand placement, overall mechanics/weight shift to maximize safety/efficiency of transfer. Other Exercises: Educated in use of RW; patient has access to one at discharge and voices understanding of importance of device.   Assessment/Plan    PT Assessment Patient needs continued PT services  PT Problem List Decreased activity tolerance;Decreased balance;Decreased mobility;Decreased cognition;Decreased knowledge of use of DME;Decreased safety awareness;Decreased knowledge of precautions       PT Treatment Interventions DME instruction;Gait training;Therapeutic exercise;Stair training;Therapeutic activities;Functional mobility training;Balance training;Neuromuscular re-education;Cognitive remediation;Patient/family education    PT Goals (Current goals can be found in the Care Plan section)  Acute Rehab PT Goals Patient Stated Goal: to return to [facility] PT Goal Formulation: With  patient Time For Goal Achievement: 10/14/17 Potential to Achieve Goals: Good Additional Goals Additional Goal #1: Indep in donning/doffing and wear schedule of L post-op shoe.    Frequency Min 2X/week   Barriers to discharge Decreased caregiver support      Co-evaluation               AM-PAC PT "6 Clicks" Daily Activity  Outcome Measure Difficulty turning over in bed (including adjusting bedclothes, sheets and blankets)?: A Little Difficulty moving from lying on back to sitting on the side of the bed? : A Little Difficulty sitting down on and standing up from a chair with arms (e.g., wheelchair, bedside commode, etc,.)?: A Little Help needed moving to and from a bed to chair (including a wheelchair)?: A Little Help needed walking in hospital room?: A Little Help needed climbing 3-5 steps with a railing? : A Little 6 Click Score: 18    End of Session Equipment Utilized During Treatment: Gait belt Activity Tolerance: Patient tolerated treatment well Patient left: in chair;with chair alarm set;with call bell/phone within reach Nurse Communication: Mobility status PT Visit Diagnosis: Unsteadiness on feet (R26.81);Muscle weakness (generalized) (M62.81);Difficulty in walking, not elsewhere classified (R26.2)    Time: 1610-9604 PT Time Calculation (min) (ACUTE ONLY): 31 min   Charges:   PT Evaluation $PT Eval Moderate Complexity: 1 Mod PT Treatments $Gait Training: 8-22 mins $Therapeutic Activity: 8-22 mins   PT G Codes:       Tracia Lacomb H. Manson Passey, PT, DPT,  NCS 09/30/17, 2:48 PM [phone]

## 2017-09-30 NOTE — Progress Notes (Signed)
Inpatient Diabetes Program Recommendations  AACE/ADA: New Consensus Statement on Inpatient Glycemic Control (2015)  Target Ranges:  Prepandial:   less than 140 mg/dL      Peak postprandial:   less than 180 mg/dL (1-2 hours)      Critically ill patients:  140 - 180 mg/dL   Results for Paul RiegerBOONE, Kolyn H (MRN [id number]) as of 09/30/2017 10:06  Ref. Range 09/30/2017 07:53  Glucose-Capillary Latest Ref Range: 65 - 99 mg/dL 657370 (H)  15 units Novolog   Results for Paul RiegerBOONE, [name] (MRN [id number]) as of 09/30/2017 10:06  Ref. Range 09/29/2017 23:49  Hemoglobin A1C Latest Ref Range: 4.8 - 5.6 % 13.2 (H)    Admit Acute SDH after Fall  History: DM, CKD, Dementia  Home DM Meds: Glipizide 5 mg daily  Current Orders: Novolog Moderate Correction Scale/ SSI (0-15 units) TID AC + HS      Glipizide 10 mg BID     PRIMARY CARE PHYSICIAN: Barbette ReichmannHande, Vishwanath, MD.  Lives at ALF [facility].  Current A1c 13.2%- very poor control at home.  Needs better control of CBGs at home.  Not sure what discharge plans will be as of yet.    Per PCP notes (from Dr. Marcello FennelHande), pt's Glipizide was increased to 10 mg BID on 09/24/17.  Note Novolog SSI started this AM.    --Will follow patient during hospitalization--  Ambrose FinlandJeannine Johnston Gissela Bloch RN, MSN, CDE Diabetes Coordinator Inpatient Glycemic Control Team Team Pager: [phone] (8a-5p)

## 2017-09-30 NOTE — H&P (Addendum)
[facility] [place] [facility]   PATIENT NAME: Paul Martinez    MR#:  [id number]  DATE OF BIRTH:  [birth year]  DATE OF ADMISSION:  09/29/2017  PRIMARY CARE PHYSICIAN: Barbette Reichmann, MD   REQUESTING/REFERRING PHYSICIAN:   CHIEF COMPLAINT:   Chief Complaint  Patient presents with  . Fall    HISTORY OF PRESENT ILLNESS: Paul Martinez  is a 84 y.o. male with a known history per below which also includes anxiety, depression, chronic kidney disease stage III, dementia, hard of hearing, presents status post mechanical fall at home not hitting his head, in the emergency room workup noted for acute right parafalcine and right frontal subdural hematomas, ED attending discussed case with [facility] neurosurgery-no intervention was recommended/patient deemed appropriate for admission to our hospital for further evaluation/management, CT C-spine also noted for 19 mm thyroid nodule for which outpatient thyroid ultrasound was recommended, creatinine 1.3, glucose 325, patient interviewed with son present at the bedside, patient is now been admitted for acute intracranial hemorrhage, incidentally found 19 mm thyroid nodule, hyperglycemia secondary to diabetes mellitus with associated nephropathy.  PAST MEDICAL HISTORY:   Past Medical History:  Diagnosis Date  . Diabetes mellitus without complication (HCC)   . Hypertension     PAST SURGICAL HISTORY: NONE  SOCIAL HISTORY:  Social History   Tobacco Use  . Smoking status: Former Games developer  . Smokeless tobacco: Never Used  Substance Use Topics  . Alcohol use: No    Frequency: Never    FAMILY HISTORY: HTN  DRUG ALLERGIES:  Allergies  Allergen Reactions  . Penicillins     REVIEW OF SYSTEMS:   CONSTITUTIONAL: No fever, fatigue or weakness.  EYES: No blurred or double vision.  EARS, NOSE, AND THROAT: No tinnitus or ear pain.  RESPIRATORY: No cough, shortness of breath, wheezing or hemoptysis.  CARDIOVASCULAR: No chest pain,  orthopnea, edema.  GASTROINTESTINAL: No nausea, vomiting, diarrhea or abdominal pain.  GENITOURINARY: No dysuria, hematuria.  ENDOCRINE: No polyuria, nocturia,  HEMATOLOGY: No anemia, easy bruising or bleeding SKIN: No rash or lesion. MUSCULOSKELETAL: No joint pain or arthritis.   NEUROLOGIC: No tingling, numbness, weakness.  Chronic hard of hearing.  Chronic dementia PSYCHIATRY: No anxiety or depression.   MEDICATIONS AT HOME:  Prior to Admission medications   Medication Sig Start Date End Date Taking? Authorizing Provider  aspirin 81 MG EC tablet Take 81 mg by mouth daily.      [provider]  lisinopril-hydrochlorothiazide (PRINZIDE,ZESTORETIC) 20-12.5 MG per tablet Take 1 tablet by mouth daily.      [provider]  mirtazapine (REMERON) 30 MG tablet Take 30 mg by mouth daily.      [provider]  niacin (NIASPAN) 500 MG CR tablet Take 500 mg by mouth at bedtime.      [provider]  Omega-3 Fatty Acids (FISH OIL) 1000 MG CAPS Take 1 capsule by mouth daily.      [provider]  pantoprazole (PROTONIX) 40 MG tablet Take 40 mg by mouth daily.      [provider]  ranitidine (ZANTAC) 150 MG capsule Take 150 mg by mouth 2 (two) times daily.      [provider]      PHYSICAL EXAMINATION:   VITAL SIGNS: Blood pressure (!) 142/65, pulse 72, temperature 98.1 F (36.7 C), temperature source Oral, resp. rate 18, height 5\' 11"  (1.803 m), weight 90.7 kg (200 lb), SpO2 94 %.  GENERAL:  84 y.o.-year-old patient lying  in the bed with no acute distress.  Frail-appearing EYES: Pupils equal, round, reactive to light and accommodation. No scleral icterus. Extraocular muscles intact.  HEENT: Head atraumatic, normocephalic. Oropharynx and nasopharynx clear.  NECK:  Supple, no jugular venous distention. No thyroid enlargement, no tenderness.  LUNGS: Normal breath sounds bilaterally, no wheezing, rales,rhonchi or crepitation. No use of  accessory muscles of respiration.  CARDIOVASCULAR: S1, S2 normal. No murmurs, rubs, or gallops.  ABDOMEN: Soft, nontender, nondistended. Bowel sounds present. No organomegaly or mass.  EXTREMITIES: No pedal edema, cyanosis, or clubbing.  NEUROLOGIC: Cranial nerves II through XII are intact. MAES. Gait not checked.  PSYCHIATRIC: The patient is alert and oriented x 3.  SKIN: No obvious rash, lesion, or ulcer.   LABORATORY PANEL:   CBC Recent Labs  Lab 09/29/17 2349  WBC 5.5  HGB 14.4  HCT 43.4  PLT 89*  MCV 92.3  MCH 30.6  MCHC 33.1  RDW 13.9   ------------------------------------------------------------------------------------------------------------------  Chemistries  Recent Labs  Lab 09/29/17 2349  NA 137  K 4.0  CL 104  CO2 25  GLUCOSE 325*  BUN 14  CREATININE 1.35*  CALCIUM 9.2   ------------------------------------------------------------------------------------------------------------------ estimated creatinine clearance is 46.1 mL/min (A) (by C-G formula based on SCr of 1.35 mg/dL (H)). ------------------------------------------------------------------------------------------------------------------ No results for input(s): TSH, T4TOTAL, T3FREE, THYROIDAB in the last 72 hours.  Invalid input(s): FREET3   Coagulation profile Recent Labs  Lab 09/29/17 2349  INR 1.13   ------------------------------------------------------------------------------------------------------------------- No results for input(s): DDIMER in the last 72 hours. -------------------------------------------------------------------------------------------------------------------  Cardiac Enzymes Recent Labs  Lab 09/29/17 2349  TROPONINI <0.03   ------------------------------------------------------------------------------------------------------------------ Invalid input(s):  POCBNP  ---------------------------------------------------------------------------------------------------------------  Urinalysis    Component Value Date/Time   COLORURINE Yellow 09/06/2013 0739   APPEARANCEUR Clear 09/06/2013 0739   LABSPEC 1.020 09/06/2013 0739   PHURINE 5.0 09/06/2013 0739   GLUCOSEU Negative 09/06/2013 0739   HGBUR Negative 09/06/2013 0739   BILIRUBINUR Negative 09/06/2013 0739   KETONESUR Negative 09/06/2013 0739   PROTEINUR Negative 09/06/2013 0739   NITRITE Negative 09/06/2013 0739   LEUKOCYTESUR Negative 09/06/2013 0739     RADIOLOGY: Dg Elbow 2 Views Right  Result Date: 09/29/2017 CLINICAL DATA:  Fall, pain to right elbow. EXAM: RIGHT ELBOW - 2 VIEW COMPARISON:  None. FINDINGS: Osseous alignment is normal. No fracture line or displaced fracture fragment identified. No appreciable joint effusion and adjacent soft tissues are unremarkable. Mild degenerative spurring at the radial head. IMPRESSION: No acute findings. No osseous fracture or dislocation. Mild degenerative spurring. Electronically Signed   By: Bary Richard M.D.   On: 09/29/2017 22:11   Dg Knee 2 Views Left  Result Date: 09/29/2017 CLINICAL DATA:  Slip on tile floor in the bathroom.  Left knee pain. EXAM: LEFT KNEE - 1-2 VIEW COMPARISON:  None. FINDINGS: No acute fracture or dislocation. Tricompartmental osteoarthritis, most prominent in the medial tibiofemoral compartment with joint space narrowing and peripheral spurring. Trace joint effusion. Possible ossified body within a Baker cyst posteriorly. There are vascular calcifications. IMPRESSION: 1. No fracture or dislocation of the left knee. 2. Moderate osteoarthritis. Electronically Signed   By: Rubye Oaks M.D.   On: 09/29/2017 23:24   Ct Head Wo Contrast  Result Date: 09/29/2017 CLINICAL DATA:  Larey Seat in bathroom on hit posterior skull on toilet EXAM: CT HEAD WITHOUT CONTRAST TECHNIQUE: Contiguous axial images were obtained from the base  of the skull through the vertex without intravenous contrast. COMPARISON:  CT 09/06/2013, MRI brain 09/07/2013 FINDINGS: Brain: No  acute territorial infarction or intracranial mass is visualized. Acute right anterior parafalcine subdural hematoma measuring 4 mm in maximum thickness with thin right frontal convexity subdural hematoma measuring 2 mm. No midline shift. No mass effect. Moderate atrophy. Moderate small vessel ischemic changes of the white matter. Old lacunar infarcts in the right basal ganglia. 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 Vascular: No hyperdense vessels. Carotid vascular calcification and vertebral calcification Skull: No depressed skull fracture Sinuses/Orbits: No acute finding. Other: None IMPRESSION: 1. Acute right anterior parafalcine subdural hematoma with thin right frontal convexity subdural hematoma. No midline shift. No significant mass effect 2. Atrophy with small vessel ischemic changes of the white matter and old left cerebellar and occipital infarcts. Critical Value/emergent results were called by telephone at the time of interpretation on 09/29/2017 at 10:28 pm to Dr. Nona DellONALD SMITH , who verbally acknowledged these results. Electronically Signed   By: Jasmine PangKim  Fujinaga M.D.   On: 09/29/2017 22:28   Ct Cervical Spine Wo Contrast  Result Date: 09/30/2017 CLINICAL DATA:  Status post fall today.  Known subdural hematoma. EXAM: CT CERVICAL SPINE WITHOUT CONTRAST TECHNIQUE: Multidetector CT imaging of the cervical spine was performed without intravenous contrast. Multiplanar CT image reconstructions were also generated. COMPARISON:  CT cervical spine 09/06/2013 FINDINGS: Alignment: Normal. Skull base and vertebrae: No acute fracture. Vertebral body heights are maintained. The dens and skull base are intact. Soft tissues and spinal canal: No prevertebral fluid or swelling. No visible canal hematoma. Disc levels: Unchanged diffuse disc space narrowing and endplate spurring. Multilevel  facet arthropathy. Multilevel neural foraminal stenosis and canal stenosis at C6-C7, unchanged from prior. Upper chest: Negative. Other: Carotid calcifications. 19 mm left thyroid nodule is new from prior CT. IMPRESSION: 1. Unchanged multilevel degenerative disc disease and facet arthropathy in the cervical spine without acute fracture. 2. New 19 mm left thyroid nodule from 2015. Recommend elective outpatient thyroid ultrasound for characterization. Electronically Signed   By: Rubye OaksMelanie  Ehinger M.D.   On: 09/30/2017 00:19   Dg Foot Complete Left  Result Date: 09/29/2017 CLINICAL DATA:  Slip and fall on bathroom floor with left foot pain. EXAM: LEFT FOOT - COMPLETE 3+ VIEW COMPARISON:  None. FINDINGS: Oblique mildly displaced and minimally comminuted fifth metatarsal fracture. No intra-articular extension. No additional acute fracture of the foot. There are hammertoe deformity of the digits. IMPRESSION: Oblique mildly displaced fifth metatarsal fracture without intra-articular extension. Electronically Signed   By: Rubye OaksMelanie  Ehinger M.D.   On: 09/29/2017 22:12    EKG: No orders found for this or any previous visit.  IMPRESSION AND PLAN: 1 acute right frontal and parafalcine SDHs Per ED attending and discussion with [facility] neurosurgery-no intervention recommended, deemed appropriate for admission to our hospital for continued care Admit to regular nursing floor bed, neurochecks per routine, fall precautions, repeat head CT in the morning for comparison, avoid antiplatelet/anticoagulants/NSAID medications, neurosurgery consulted for expert opinion, physical therapy to evaluate in the a.m.  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 Management plans discussed with the patient, family and they are in agreement.  CODE STATUS:full Code Status History    This patient does not have a recorded code status. Please follow your organizational policy for patients in this situation.    Advance Directive Documentation     Most Recent Value  Type of Advance Directive  Healthcare Power of Attorney, Living will  Pre-existing out of facility DNR order (yellow form or pink MOST form)  No data  "MOST" Form in Place?  No data       TOTAL TIME TAKING CARE OF THIS PATIENT: 45 minutes of critical care time was used to review documentation, discuss plan of care with subspecialty, interview with discussions with the patient's and the patient's family with all questions answered.    Evelena Asa Salary M.D on 09/30/2017   Between 7am to 6pm - Pager - [phone]  After 6pm go to [email or web address] - password [email or web address]  [facility]  Office  [phone]  CC: Primary care physician; Barbette Reichmann, MD   Note: This dictation was prepared with Dragon dictation along with smaller phrase technology. Any transcriptional errors that result from this process are unintentional.

## 2020-09-21 ENCOUNTER — Emergency Department: Payer: Medicare Other

## 2020-09-21 ENCOUNTER — Observation Stay
Admission: EM | Admit: 2020-09-21 | Discharge: 2020-09-23 | Disposition: A | Discharge status: Home / Self Care | Payer: Medicare Other | Attending: Internal Medicine | Admitting: Internal Medicine

## 2020-09-21 ENCOUNTER — Observation Stay: Payer: Medicare Other

## 2020-09-21 ENCOUNTER — Other Ambulatory Visit: Payer: Self-pay

## 2020-09-21 ENCOUNTER — Observation Stay
Admit: 2020-09-21 | Discharge: 2020-09-21 | Disposition: A | Discharge status: Home / Self Care | Payer: Medicare Other | Attending: Internal Medicine | Admitting: Internal Medicine

## 2020-09-21 DIAGNOSIS — R1310 Dysphagia, unspecified: Secondary | ICD-10-CM | POA: Insufficient documentation

## 2020-09-21 DIAGNOSIS — I10 Essential (primary) hypertension: Secondary | ICD-10-CM | POA: Diagnosis not present

## 2020-09-21 DIAGNOSIS — Z20822 Contact with and (suspected) exposure to covid-19: Secondary | ICD-10-CM | POA: Insufficient documentation

## 2020-09-21 DIAGNOSIS — R29898 Other symptoms and signs involving the musculoskeletal system: Secondary | ICD-10-CM

## 2020-09-21 DIAGNOSIS — K219 Gastro-esophageal reflux disease without esophagitis: Secondary | ICD-10-CM | POA: Diagnosis present

## 2020-09-21 DIAGNOSIS — N189 Chronic kidney disease, unspecified: Secondary | ICD-10-CM | POA: Diagnosis not present

## 2020-09-21 DIAGNOSIS — I639 Cerebral infarction, unspecified: Secondary | ICD-10-CM

## 2020-09-21 DIAGNOSIS — E119 Type 2 diabetes mellitus without complications: Secondary | ICD-10-CM

## 2020-09-21 DIAGNOSIS — Z79899 Other long term (current) drug therapy: Secondary | ICD-10-CM | POA: Diagnosis not present

## 2020-09-21 DIAGNOSIS — F0390 Unspecified dementia without behavioral disturbance: Secondary | ICD-10-CM | POA: Diagnosis not present

## 2020-09-21 DIAGNOSIS — M6281 Muscle weakness (generalized): Secondary | ICD-10-CM | POA: Diagnosis present

## 2020-09-21 DIAGNOSIS — Z7982 Long term (current) use of aspirin: Secondary | ICD-10-CM | POA: Insufficient documentation

## 2020-09-21 DIAGNOSIS — I129 Hypertensive chronic kidney disease with stage 1 through stage 4 chronic kidney disease, or unspecified chronic kidney disease: Secondary | ICD-10-CM | POA: Insufficient documentation

## 2020-09-21 DIAGNOSIS — Z87891 Personal history of nicotine dependence: Secondary | ICD-10-CM | POA: Diagnosis not present

## 2020-09-21 DIAGNOSIS — E1122 Type 2 diabetes mellitus with diabetic chronic kidney disease: Secondary | ICD-10-CM | POA: Diagnosis not present

## 2020-09-21 DIAGNOSIS — F32A Depression, unspecified: Secondary | ICD-10-CM | POA: Diagnosis present

## 2020-09-21 DIAGNOSIS — Z7984 Long term (current) use of oral hypoglycemic drugs: Secondary | ICD-10-CM | POA: Diagnosis not present

## 2020-09-21 DIAGNOSIS — D696 Thrombocytopenia, unspecified: Secondary | ICD-10-CM | POA: Diagnosis present

## 2020-09-21 HISTORY — DX: Gastro-esophageal reflux disease without esophagitis: K21.9

## 2020-09-21 HISTORY — DX: Thrombocytopenia, unspecified: D69.6

## 2020-09-21 LAB — CBC WITH DIFFERENTIAL/PLATELET
Abs Immature Granulocytes: 0.02 10*3/uL (ref 0.00–0.07)
Basophils Absolute: 0 10*3/uL (ref 0.0–0.1)
Basophils Relative: 1 %
Eosinophils Absolute: 0.1 10*3/uL (ref 0.0–0.5)
Eosinophils Relative: 2 %
HCT: 40.8 % (ref 39.0–52.0)
Hemoglobin: 13.4 g/dL (ref 13.0–17.0)
Immature Granulocytes: 0 %
Lymphocytes Relative: 17 %
Lymphs Abs: 1 10*3/uL (ref 0.7–4.0)
MCH: 30.5 pg (ref 26.0–34.0)
MCHC: 32.8 g/dL (ref 30.0–36.0)
MCV: 92.7 fL (ref 80.0–100.0)
Monocytes Absolute: 0.6 10*3/uL (ref 0.1–1.0)
Monocytes Relative: 10 %
Neutro Abs: 4.2 10*3/uL (ref 1.7–7.7)
Neutrophils Relative %: 70 %
Platelets: 105 10*3/uL — ABNORMAL LOW (ref 150–400)
RBC: 4.4 MIL/uL (ref 4.22–5.81)
RDW: 14.8 % (ref 11.5–15.5)
WBC: 6 10*3/uL (ref 4.0–10.5)
nRBC: 0 % (ref 0.0–0.2)

## 2020-09-21 LAB — TROPONIN I (HIGH SENSITIVITY): Troponin I (High Sensitivity): 8 ng/L (ref <18)

## 2020-09-21 LAB — URINALYSIS, COMPLETE (UACMP) WITH MICROSCOPIC
Bacteria, UA: NONE SEEN
Bilirubin Urine: NEGATIVE
Glucose, UA: NEGATIVE mg/dL
Hgb urine dipstick: NEGATIVE
Ketones, ur: NEGATIVE mg/dL
Nitrite: NEGATIVE
Protein, ur: NEGATIVE mg/dL
Specific Gravity, Urine: 1.019 (ref 1.005–1.030)
pH: 5 (ref 5.0–8.0)

## 2020-09-21 LAB — PROTIME-INR
INR: 1.1 (ref 0.8–1.2)
Prothrombin Time: 13.9 seconds (ref 11.4–15.2)

## 2020-09-21 LAB — COMPREHENSIVE METABOLIC PANEL
ALT: 20 U/L (ref 0–44)
AST: 27 U/L (ref 15–41)
Albumin: 3.8 g/dL (ref 3.5–5.0)
Alkaline Phosphatase: 108 U/L (ref 38–126)
Anion gap: 9 (ref 5–15)
BUN: 11 mg/dL (ref 8–23)
CO2: 25 mmol/L (ref 22–32)
Calcium: 9.1 mg/dL (ref 8.9–10.3)
Chloride: 106 mmol/L (ref 98–111)
Creatinine, Ser: 1.04 mg/dL (ref 0.61–1.24)
GFR, Estimated: >60 mL/min (ref >60)
Glucose, Bld: 125 mg/dL — ABNORMAL HIGH (ref 70–99)
Potassium: 4.4 mmol/L (ref 3.5–5.1)
Sodium: 140 mmol/L (ref 135–145)
Total Bilirubin: 1.5 mg/dL — ABNORMAL HIGH (ref 0.3–1.2)
Total Protein: 7.1 g/dL (ref 6.5–8.1)

## 2020-09-21 LAB — GLUCOSE, CAPILLARY
Glucose-Capillary: 105 mg/dL — ABNORMAL HIGH (ref 70–99)
Glucose-Capillary: 190 mg/dL — ABNORMAL HIGH (ref 70–99)

## 2020-09-21 MED ORDER — PANTOPRAZOLE SODIUM 20 MG PO TBEC
20.0000 mg | DELAYED_RELEASE_TABLET | Freq: Every day | ORAL | Status: DC
Start: 2020-09-22 — End: 2020-09-23
  Administered 2020-09-22: 20 mg via ORAL
  Filled 2020-09-21 (×2): qty 1

## 2020-09-21 MED ORDER — INSULIN ASPART 100 UNIT/ML ~~LOC~~ SOLN
0.0000 [IU] | Freq: Three times a day (TID) | SUBCUTANEOUS | Status: DC
  Administered 2020-09-22: 2 [IU] via SUBCUTANEOUS
  Filled 2020-09-21: qty 1

## 2020-09-21 MED ORDER — ACETAMINOPHEN 160 MG/5ML PO SOLN
650.0000 mg | ORAL | Status: DC | PRN
  Filled 2020-09-21: qty 20.3

## 2020-09-21 MED ORDER — DM-GUAIFENESIN ER 30-600 MG PO TB12: 1.0000 | ORAL_TABLET | Freq: Two times a day (BID) | ORAL | Status: DC | PRN

## 2020-09-21 MED ORDER — ONDANSETRON HCL 4 MG/2ML IJ SOLN
4.0000 mg | Freq: Three times a day (TID) | INTRAMUSCULAR | Status: DC | PRN
  Administered 2020-09-22 (×2): 4 mg via INTRAVENOUS
  Filled 2020-09-21 (×3): qty 2

## 2020-09-21 MED ORDER — HYDRALAZINE HCL 20 MG/ML IJ SOLN: 5.0000 mg | INTRAMUSCULAR | Status: DC | PRN

## 2020-09-21 MED ORDER — ATORVASTATIN CALCIUM 20 MG PO TABS
40.0000 mg | ORAL_TABLET | Freq: Every day | ORAL | Status: DC
  Administered 2020-09-21 – 2020-09-22 (×2): 40 mg via ORAL
  Filled 2020-09-21 (×3): qty 2

## 2020-09-21 MED ORDER — SENNOSIDES-DOCUSATE SODIUM 8.6-50 MG PO TABS: 1.0000 | ORAL_TABLET | Freq: Every evening | ORAL | Status: DC | PRN

## 2020-09-21 MED ORDER — ENOXAPARIN SODIUM 40 MG/0.4ML ~~LOC~~ SOLN
40.0000 mg | SUBCUTANEOUS | Status: DC
  Administered 2020-09-21: 20:00:00 40 mg via SUBCUTANEOUS
  Filled 2020-09-21: qty 0.4

## 2020-09-21 MED ORDER — POLYSACCHARIDE IRON COMPLEX 150 MG PO CAPS
150.0000 mg | ORAL_CAPSULE | Freq: Every day | ORAL | Status: DC
  Administered 2020-09-22: 150 mg via ORAL
  Filled 2020-09-21 (×2): qty 1

## 2020-09-21 MED ORDER — ADULT MULTIVITAMIN W/MINERALS CH
1.0000 | ORAL_TABLET | Freq: Every day | ORAL | Status: DC
  Administered 2020-09-22: 1 via ORAL
  Filled 2020-09-21 (×2): qty 1

## 2020-09-21 MED ORDER — SERTRALINE HCL 50 MG PO TABS
100.0000 mg | ORAL_TABLET | Freq: Every day | ORAL | Status: DC
  Administered 2020-09-22: 10:00:00 100 mg via ORAL
  Filled 2020-09-21 (×2): qty 2

## 2020-09-21 MED ORDER — INSULIN ASPART 100 UNIT/ML ~~LOC~~ SOLN: 0.0000 [IU] | Freq: Every day | SUBCUTANEOUS | Status: DC

## 2020-09-21 MED ORDER — ASPIRIN 81 MG PO CHEW
324.0000 mg | CHEWABLE_TABLET | Freq: Once | ORAL | Status: AC
  Administered 2020-09-21: 324 mg via ORAL
  Filled 2020-09-21: qty 4

## 2020-09-21 MED ORDER — DONEPEZIL HCL 5 MG PO TABS
10.0000 mg | ORAL_TABLET | Freq: Every day | ORAL | Status: DC
  Administered 2020-09-21 – 2020-09-22 (×2): 10 mg via ORAL
  Filled 2020-09-21 (×2): qty 2

## 2020-09-21 MED ORDER — STROKE: EARLY STAGES OF RECOVERY BOOK: Freq: Once | Status: AC

## 2020-09-21 MED ORDER — ASPIRIN 325 MG PO TABS
325.0000 mg | ORAL_TABLET | Freq: Every day | ORAL | Status: DC
  Administered 2020-09-22: 10:00:00 325 mg via ORAL
  Filled 2020-09-21 (×2): qty 1

## 2020-09-21 MED ORDER — ASPIRIN 300 MG RE SUPP: 300.0000 mg | Freq: Every day | RECTAL | Status: DC

## 2020-09-21 MED ORDER — VITAMIN B-12 1000 MCG PO TABS
1000.0000 ug | ORAL_TABLET | Freq: Every day | ORAL | Status: DC
  Administered 2020-09-22: 1000 ug via ORAL
  Filled 2020-09-21 (×2): qty 1

## 2020-09-21 MED ORDER — ACETAMINOPHEN 650 MG RE SUPP: 650.0000 mg | RECTAL | Status: DC | PRN

## 2020-09-21 MED ORDER — ACETAMINOPHEN 325 MG PO TABS
650.0000 mg | ORAL_TABLET | ORAL | Status: DC | PRN
  Administered 2020-09-22: 23:00:00 650 mg via ORAL
  Filled 2020-09-21: qty 2

## 2020-09-21 NOTE — H&P (Addendum)
History and Physical    Paul Martinez MBT:[id number] DOB: 1930/05/10 DOA: 09/21/2020  Referring MD/NP/PA:   PCP: Barbette Reichmann, MD   Patient coming from:  The patient is coming from ALF per his son.  At baseline, pt is dependent for most of ADL.        Chief Complaint: left arm weakness  HPI: Paul Martinez is a 87 y.o. male with medical history significant of dementia, hypertension, diabetes mellitus, GERD, depression, ICH, thrombocytopenia, who presents with left arm weakness.  Per his son, patient developed left arm weakness when he was doing physical therapy in ALF this AM, at about 10:00 to 10:30. Physical therapy also noted that there seem to be some shaking and twitching in the left arm.  Due to history of dementia, patient cannot provide detailed information.  Per his son, he patient's mental status is at normal baseline.  He has chronic hearing loss, which has not changed.  No new vision loss and slurred speech.  He moves his legs normally. No fall.  Patient does not have chest pain, shortness breath, cough.  No nausea, vomiting, diarrhea or abdominal pain.  No symptoms of UTI per his son.   ED Course: pt was found to have WBC 6.0, INR 1.1, troponin level 8, pending COVID-19 PCR, electrolytes renal function okay, temperature normal, blood pressure 147/86, heart rate 65, 14, oxygen saturation 97% on room air.  CT head is negative for acute intracranial abnormalities.  Patient is placed on MedSurg bed of observation. Dr. Iver Nestle of neurology is consulted.  Review of Systems: Could not reviewed accurately due to dementia.   Allergy:  Allergies  Allergen Reactions  . Penicillins     nka is listed on MAR    Past Medical History:  Diagnosis Date  . Diabetes mellitus without complication (HCC)   . GERD (gastroesophageal reflux disease)   . Hypertension   . Thrombocytopenia (HCC)     History reviewed. No pertinent surgical history.  Social History:  reports that he has  quit smoking. He has never used smokeless tobacco. He reports that he does not drink alcohol and does not use drugs.  Family History:  Family History  Problem Relation Age of Onset  . Stroke Mother   . Stroke Father   . Stroke Brother   . Cerebral aneurysm Brother      Prior to Admission medications   Medication Sig Start Date End Date Taking? Authorizing Provider  acetaminophen (TYLENOL) 325 MG tablet Take 650 mg by mouth every 6 (six) hours as needed.    [provider]  glipiZIDE (GLUCOTROL) 5 MG tablet Take 1 tablet (5 mg total) by mouth 2 (two) times daily before a meal. 09/30/17   Enedina Finner, MD  HYDROcodone-acetaminophen (NORCO/VICODIN) 5-325 MG tablet Take 1-2 tablets by mouth every 6 (six) hours as needed for moderate pain. 09/30/17   Enedina Finner, MD  lisinopril (PRINIVIL,ZESTRIL) 40 MG tablet Take 40 mg by mouth daily.    [provider]  mirtazapine (REMERON) 30 MG tablet Take 30 mg by mouth daily.      [provider]  Multiple Vitamins-Minerals (PRESERVISION AREDS 2+MULTI VIT PO) Take 1 tablet by mouth 2 (two) times daily.    [provider]  niacin (NIASPAN) 500 MG CR tablet Take 500 mg by mouth at bedtime.      [provider]  Omega-3 Fatty Acids (FISH OIL) 1000 MG CAPS Take 1 capsule by mouth daily.  [provider]  pantoprazole (PROTONIX) 40 MG tablet Take 40 mg by mouth daily.      [provider]  pseudoephedrine-guaifenesin (MUCINEX D) 60-600 MG 12 hr tablet Take 1 tablet by mouth every 12 (twelve) hours.    [provider]  ranitidine (ZANTAC) 150 MG capsule Take 150 mg by mouth 2 (two) times daily.      [provider]  sertraline (ZOLOFT) 50 MG tablet Take 50 mg by mouth daily.    [provider]  vitamin B-12 (CYANOCOBALAMIN) 1000 MCG tablet Take 1,000 mcg by mouth daily.    [provider]    Physical Exam: Vitals:   09/21/20 1130 09/21/20 1200 09/21/20 1230  09/21/20 1300  BP: (!) 155/82 (!) 144/80 133/74 (!) 147/86  Pulse: 62 61 64 65  Resp: 16 16 16 14   Temp:      TempSrc:      SpO2: 98% 97% 97% 97%  Weight:      Height:       General: Not in acute distress HEENT:       Eyes: PERRL, EOMI, no scleral icterus.       ENT: No discharge from the ears and nose.       Neck: No JVD, no bruit, no mass felt. Heme: No neck lymph node enlargement. Cardiac: S1/S2, RRR, No murmurs, No gallops or rubs. Respiratory: No rales, wheezing, rhonchi or rubs. GI: Soft, nondistended, nontender,  no organomegaly, BS present. GU: No hematuria Ext: No pitting leg edema bilaterally. 1+DP/PT pulse bilaterally. Musculoskeletal: No joint deformities, No joint redness or warmth, no limitation of ROM in spin. Skin: No rashes.  Neuro: Alert, knows his own name, knows that he is in hospital, cranial nerves II-XII grossly intact except for bilateral hearing loss. Muscle strength 2/5 in left am and 5/5.  in other  extremities. Brachial reflex 2+ bilaterally. Negative Babinski's sign.  Psych: Patient is not psychotic, no suicidal or hemocidal ideation.  Labs on Admission: I have personally reviewed following labs and imaging studies  CBC: Recent Labs  Lab 09/21/20 1155  WBC 6.0  NEUTROABS 4.2  HGB 13.4  HCT 40.8  MCV 92.7  PLT 105*   Basic Metabolic Panel: Recent Labs  Lab 09/21/20 1155  NA 140  K 4.4  CL 106  CO2 25  GLUCOSE 125*  BUN 11  CREATININE 1.04  CALCIUM 9.1   GFR: Estimated Creatinine Clearance: 58.6 mL/min (by C-G formula based on SCr of 1.04 mg/dL). Liver Function Tests: Recent Labs  Lab 09/21/20 1155  AST 27  ALT 20  ALKPHOS 108  BILITOT 1.5*  PROT 7.1  ALBUMIN 3.8   No results for input(s): LIPASE, AMYLASE in the last 168 hours. No results for input(s): AMMONIA in the last 168 hours. Coagulation Profile: Recent Labs  Lab 09/21/20 1155  INR 1.1   Cardiac Enzymes: No results for input(s): CKTOTAL, CKMB, CKMBINDEX,  TROPONINI in the last 168 hours. BNP (last 3 results) No results for input(s): PROBNP in the last 8760 hours. HbA1C: No results for input(s): HGBA1C in the last 72 hours. CBG: No results for input(s): GLUCAP in the last 168 hours. Lipid Profile: No results for input(s): CHOL, HDL, LDLCALC, TRIG, CHOLHDL, LDLDIRECT in the last 72 hours. Thyroid Function Tests: No results for input(s): TSH, T4TOTAL, FREET4, T3FREE, THYROIDAB in the last 72 hours. Anemia Panel: No results for input(s): VITAMINB12, FOLATE, FERRITIN, TIBC, IRON, RETICCTPCT in the last 72 hours. Urine analysis:  Component Value Date/Time   COLORURINE YELLOW (A) 09/21/2020 1345   APPEARANCEUR HAZY (A) 09/21/2020 1345   APPEARANCEUR Clear 09/06/2013 0739   LABSPEC 1.019 09/21/2020 1345   LABSPEC 1.020 09/06/2013 0739   PHURINE 5.0 09/21/2020 1345   GLUCOSEU NEGATIVE 09/21/2020 1345   GLUCOSEU Negative 09/06/2013 0739   HGBUR NEGATIVE 09/21/2020 1345   BILIRUBINUR NEGATIVE 09/21/2020 1345   BILIRUBINUR Negative 09/06/2013 0739   KETONESUR NEGATIVE 09/21/2020 1345   PROTEINUR NEGATIVE 09/21/2020 1345   NITRITE NEGATIVE 09/21/2020 1345   LEUKOCYTESUR TRACE (A) 09/21/2020 1345   LEUKOCYTESUR Negative 09/06/2013 0739   Sepsis Labs: @LABRCNTIP (procalcitonin:4,lacticidven:4) )No results found for this or any previous visit (from the past 240 hour(s)).   Radiological Exams on Admission: CT Head Wo Contrast  Result Date: 09/21/2020 CLINICAL DATA:  Fall left arm weakness. EXAM: CT HEAD WITHOUT CONTRAST TECHNIQUE: Contiguous axial images were obtained from the base of the skull through the vertex without intravenous contrast. COMPARISON:  CT head dated September 30, 2017. FINDINGS: Brain: No evidence of acute infarction, hemorrhage, hydrocephalus, extra-axial collection or mass lesion/mass effect. 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 Stable atrophy. Progressive microvascular ischemic changes.  Vascular: Calcified atherosclerosis at the skullbase. No hyperdense vessel. Skull: Normal. Negative for fracture or focal lesion. Sinuses/Orbits: No acute finding. Other: None. IMPRESSION: 1. No acute intracranial abnormality. 2. Progressive chronic microvascular ischemic changes. Electronically Signed   By: October 02, 2017 M.D.   On: 09/21/2020 12:36     EKG: I have personally reviewed.  Sinus rhythm, QTC 450, early R wave progression, low voltage, Q waves only in lead III.  Assessment/Plan Principal Problem:   Left arm weakness Active Problems:   Diabetes mellitus without complication (HCC)   Hypertension   GERD (gastroesophageal reflux disease)   Depression   Thrombocytopenia (HCC)   Dementia (HCC)   Stroke (HCC)   Left arm weakness due to possible stroke: CT-head negative. Pt has persistent left arm weakness, concerning for possible stroke. Dr. 11/19/2020 of neuro is consulted  - Placed on MedSurg bed for observation - Obtain MRI/MRA - will hold oral Bp meds to allow permissive HTN in the setting of acute stroke  - Check carotid dopplers  - ASA  - start lipitor 40 mg daily - fasting lipid panel and HbA1c  - 2D transthoracic echocardiography  - swallowing screen. If fails, will get SLP - PT/OT consult  Diabetes mellitus without complication (HCC): Recent A1c 6.0 on 03/17/2020, well controlled.  Patient taking glipizide and Metformin -SSI  Hypertension: -IV prn hydralazine for SBP>220 or dBP>120 -Hold lisinopril  GERD (gastroesophageal reflux disease) -Protonix  Depression: -Continue home Zoloft  Thrombocytopenia (HCC): This is chronic issue.  Recent platelet 89 on 09/29/2017 -Follow-up by CBC  Dementia (HCC) -Donepezil    DVT ppx:  SQ Lovenox Code Status: DNR per his son Family Communication:  Yes, patient's son  at bed side Disposition Plan:  Anticipate discharge back to previous environment Consults called:  Dr. 10/01/2017 of neuro Admission status and Level of  care: Med-Surg:    Med-surg bed for obs   Status is: Observation  The patient remains OBS appropriate and will d/c before 2 midnights.  Dispo: The patient is from: ALF              Anticipated d/c is to: ALF              Anticipated d/c date is: 1 day  Patient currently is not medically stable to d/c.   Difficult to place patient No         Date of Service 09/21/2020    Lorretta Harp [facility]   If 7PM-7AM, please contact night-coverage [email or web address] 09/21/2020, 2:38 PM

## 2020-09-21 NOTE — ED Provider Notes (Signed)
[facility] Emergency Department Provider Note   ____________________________________________   Event Date/Time   First MD Initiated Contact with Patient 09/21/20 1112     (approximate)  I have reviewed the triage vital signs and the nursing notes.   HISTORY  Chief Complaint Extremity Weakness    HPI Paul Martinez is a 87 y.o. male with past medical history of hypertension, diabetes, CKD, and dementia who presents to the ED for weakness.  Patient was working with physical therapy at home place of [place] earlier today, when it was noticed that his left arm seemed weak and drooping.  Physical therapy also noted that there seem to be some shaking and twitching in the arm.  History is limited due to patient's dementia, he states he does not remember any of this happening and that he feels fine currently.  He states he is not sure why he is here at the hospital, asks "am I sick?"  He denies any vision changes, speech changes, numbness, or weakness.  He denies any pain in his left arm and has not had any recent trauma to the arm.  He has otherwise been feeling well with no fever, cough, chest pain, or shortness of breath.        Past Medical History:  Diagnosis Date  . Diabetes mellitus without complication (HCC)   . GERD (gastroesophageal reflux disease)   . Hypertension   . Thrombocytopenia [facility])     Patient Active Problem List   Diagnosis Date Noted  . Left arm weakness 09/21/2020  . Diabetes mellitus without complication (HCC)   . Hypertension   . GERD (gastroesophageal reflux disease)   . Depression   . Thrombocytopenia (HCC)   . ICH (intracerebral hemorrhage) (HCC) 09/30/2017  . DIZZINESS 02/23/2010  . DYSPNEA 02/23/2010  . ABNORMAL ELECTROCARDIOGRAM 02/23/2010    History reviewed. No pertinent surgical history.  Prior to Admission medications   Medication Sig Start Date End Date Taking? Authorizing Provider  aspirin EC 81 MG tablet  Take 81 mg by mouth daily. Swallow whole.   Yes [provider]  donepezil (ARICEPT) 10 MG tablet Take 10 mg by mouth at bedtime. 09/03/20  Yes [provider]  glipiZIDE (GLUCOTROL) 10 MG tablet Take 10 mg by mouth daily before breakfast. 11/16/19  Yes [provider]  iron polysaccharides (NIFEREX) 150 MG capsule Take 150 mg by mouth daily.   Yes [provider]  lisinopril (PRINIVIL,ZESTRIL) 40 MG tablet Take 40 mg by mouth daily.   Yes [provider]  metFORMIN (GLUCOPHAGE) 500 MG tablet Take 500 mg by mouth 2 (two) times daily with a meal.   Yes [provider]  Multiple Vitamins-Minerals (PRESERVISION AREDS 2+MULTI VIT PO) Take 1 tablet by mouth 2 (two) times daily.   Yes [provider]  pantoprazole (PROTONIX) 20 MG tablet Take 20 mg by mouth daily. 08/01/20  Yes [provider]  sertraline (ZOLOFT) 50 MG tablet Take 100 mg by mouth daily.   Yes [provider]  vitamin B-12 (CYANOCOBALAMIN) 1000 MCG tablet Take 1,000 mcg by mouth daily.   Yes [provider]  acetaminophen (TYLENOL) 325 MG tablet Take 650 mg by mouth every 6 (six) hours as needed.    [provider]  buPROPion (WELLBUTRIN XL) 150 MG 24 hr tablet Take 150 mg by mouth every morning. 09/20/20   [provider]  HYDROcodone-acetaminophen (NORCO/VICODIN) 5-325 MG tablet Take 1-2 tablets by mouth every 6 (six) hours as  needed for moderate pain. Patient not taking: No sig reported 09/30/17   Enedina Finner, MD  pseudoephedrine-guaifenesin [facility] D) 60-600 MG 12 hr tablet Take 1 tablet by mouth every 12 (twelve) hours.    [provider]    Allergies Penicillins  No family history on file.  Social History Social History   Tobacco Use  . Smoking status: Former Games developer  . Smokeless tobacco: Never Used  Substance Use Topics  . Alcohol use: No  . Drug use: No    Review of Systems  Constitutional: No  fever/chills Eyes: No visual changes. ENT: No sore throat. Cardiovascular: Denies chest pain. Respiratory: Denies shortness of breath. Gastrointestinal: No abdominal pain.  No nausea, no vomiting.  No diarrhea.  No constipation. Genitourinary: Negative for dysuria. Musculoskeletal: Negative for back pain. Skin: Negative for rash. Neurological: Negative for headaches, focal weakness or numbness.  Positive for left arm weakness.  ____________________________________________   PHYSICAL EXAM:  VITAL SIGNS: ED Triage Vitals  Enc Vitals Group     BP 09/21/20 1110 (!) 145/72     Pulse Rate 09/21/20 1110 64     Resp 09/21/20 1110 18     Temp 09/21/20 1115 98.1 F (36.7 C)     Temp Source 09/21/20 1115 Oral     SpO2 09/21/20 1110 98 %     Weight 09/21/20 1111 216 lb 0.8 oz (98 kg)     Height 09/21/20 1111 5\' 11"  (1.803 m)     Head Circumference --      Peak Flow --      Pain Score --      Pain Loc --      Pain Edu? --      Excl. in GC? --     Constitutional: Alert and oriented to person and place, but not time. Eyes: Conjunctivae are normal. Head: Atraumatic. Nose: No congestion/rhinnorhea. Mouth/Throat: Mucous membranes are moist. Neck: Normal ROM Cardiovascular: Normal rate, regular rhythm. Grossly normal heart sounds. Respiratory: Normal respiratory effort.  No retractions. Lungs CTAB. Gastrointestinal: Soft and nontender. No distention. Genitourinary: deferred Musculoskeletal: No lower extremity tenderness nor edema. Neurologic:  Normal speech and language.  4+ out of 5 strength in left upper extremity with pronator drift noted.  Otherwise 5-5 strength in right upper extremity and bilateral lower extremities. Skin:  Skin is warm, dry and intact. No rash noted. Psychiatric: Mood and affect are normal. Speech and behavior are normal.  ____________________________________________   LABS (all labs ordered are listed, but only abnormal results are displayed)  Labs  Reviewed  CBC WITH DIFFERENTIAL/PLATELET - Abnormal; Notable for the following components:      Result Value   Platelets 105 (*)    All other components within normal limits  COMPREHENSIVE METABOLIC PANEL - Abnormal; Notable for the following components:   Glucose, Bld 125 (*)    Total Bilirubin 1.5 (*)    All other components within normal limits  SARS CORONAVIRUS 2 (TAT 6-24 HRS)  PROTIME-INR  URINALYSIS, COMPLETE (UACMP) WITH MICROSCOPIC  TROPONIN I (HIGH SENSITIVITY)   ____________________________________________  EKG  ED ECG REPORT I, , the attending physician, personally viewed and interpreted this ECG.   Date: 09/21/2020  EKG Time: 11:08  Rate: 62  Rhythm: normal sinus rhythm  Axis: Normal  Intervals:nonspecific intraventricular conduction delay  ST&T Change: None   PROCEDURES  Procedure(s) performed (including Critical Care):  Procedures   ____________________________________________   INITIAL IMPRESSION / ASSESSMENT AND PLAN / ED COURSE  87 year old male with past medical history of hypertension, diabetes, CKD, and dementia who presents to the ED with left arm weakness noted at physical therapy earlier today.  Even patients dementia, history is limited and his last known well time is unclear.  He does appear to have some weakness in his left upper extremity but no other focal neurologic deficits.  I am concerned for stroke and we will further assess with CT head, but I doubt large vessel occlusion.  There is no indication for code stroke at this time given unclear onset.  CT head reviewed by me and shows no obvious hemorrhage, negative for acute process per radiology.  Lab work is unremarkable and while patient denied any symptoms previously, he now states that his left arm "feels dead."  He continues to have drift in his left upper extremity on reassessment and case discussed with hospitalist for admission for further stroke work-up.  He  denies any pain in his left arm and does not have any neck pain to suggest radiculopathy.  Perfusion is intact to his left upper extremity with 2+ radial pulse and cap refill less than 2 seconds.      ____________________________________________   FINAL CLINICAL IMPRESSION(S) / ED DIAGNOSES  Final diagnoses:  Left arm weakness  Cerebrovascular accident (CVA), unspecified mechanism [facility])     ED Discharge Orders    None       Note:  This document was prepared using Dragon voice recognition software and may include unintentional dictation errors.   Chesley Noon, MD 09/21/20 [phone]

## 2020-09-21 NOTE — Consult Note (Addendum)
Neurology Consultation Reason for Consult: R MCA territory stroke Requesting Physician: Carma Leaven  CC: Left-sided weakness  History is obtained from: Primarily chart review given patient's underlying dementia as well as son at bedside  HPI: Paul Martinez is a 87 y.o. 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 He was noted to have some mild left upper extremity weakness on ED evaluation.  His symptoms were initially resolving, per the patient's report, but then recurred.  He was later noted to have some difficulty with dragging the left leg as well when walking.  Son reports that the patient was complaining of some left shoulder pain on Tuesday 12/8 but is unsure if he had weakness at that time.  He has also had reduced appetite in the last week and was noted to have a low blood sugar to the 40s which and attributes to the patient still grieving the loss of his wife.  Additionally he has a right knee abrasion which is secondary to recent fall  Regarding his ICH history, he is noted to have acute right frontal and parafalcine subdural hemorrhages on 09/30/2017 after mechanical fall.  He remained clinically stable and was discharged with plans to hold antiplatelet therapy for a week and outpatient neurosurgery follow-up  Per EMR records, he was seen Dr. Tanna Furry at [facility] for memory loss including repeating conversations and forgetting appointments.  At that time (2019) he was receiving assistance with medications and cooking as well as his finances, but showering and getting dressed without assistance  LKW: Unclear last known well, symptom discovery 10 AM and 2/9 tPA given?: No, due to  unclear last known well  Premorbid modified rankin scale:      0 - No symptoms.     1 - No significant disability. Able to carry out all usual activities, despite some symptoms.     2 - Slight disability. Able to look after own affairs without assistance, but unable to carry out all previous activities.     3 - Moderate disability. Requires some help, but able to walk unassisted.     4 - Moderately severe disability. Unable to attend to own bodily needs without assistance, and unable to walk unassisted.     5 - Severe disability. Requires constant nursing care and attention, bedridden, incontinent.     6 - Dead.   ROS: Unable to obtain due to baseline dementia  Past Medical History:  Diagnosis Date  . Diabetes mellitus without complication (HCC)   . GERD (gastroesophageal reflux disease)   . Hypertension   . Thrombocytopenia (HCC)    History reviewed. No pertinent surgical history.  Current Outpatient Medications  Medication Instructions  . acetaminophen (TYLENOL) 650 mg, Oral, Every 6 hours PRN  . aspirin EC 81 mg, Oral, Daily, Swallow whole.  Marland Kitchen buPROPion (WELLBUTRIN XL) 150 mg, Oral, Every morning  . donepezil (ARICEPT) 10 mg, Oral, Nightly  . glipiZIDE (GLUCOTROL) 10 mg, Oral, Daily before breakfast  . HYDROcodone-acetaminophen (NORCO/VICODIN) 5-325 MG tablet 1-2 tablets, Oral, Every 6 hours PRN  . iron polysaccharides (NIFEREX) 150 mg, Oral, Daily  . lisinopril (ZESTRIL) 40 mg, Oral, Daily  . metFORMIN (GLUCOPHAGE) 500 mg, Oral, 2 times daily with meals  . Multiple Vitamins-Minerals (PRESERVISION AREDS 2+MULTI  VIT PO) 1 tablet, Oral, 2 times daily  . pantoprazole (PROTONIX) 20 mg, Oral, Daily  . pseudoephedrine-guaifenesin (MUCINEX D) 60-600 MG 12 hr tablet 1 tablet, Oral, Every 12 hours  . sertraline (ZOLOFT) 100 mg, Oral, Daily  . vitamin B-12 (CYANOCOBALAMIN) 1,000 mcg, Oral, Daily    Family History  Problem Relation Age of Onset  . Stroke Mother   . Stroke Father    . Stroke Brother   . Cerebral aneurysm Brother     Social History:  reports that he has quit smoking. He has never used smokeless tobacco. He reports that he does not drink alcohol and does not use drugs.   Exam: Current vital signs: BP (!) 156/68 (BP Location: Right Arm)   Pulse 71   Temp 98.2 F (36.8 C)   Resp 20   Ht 5\' 11"  (1.803 m)   Wt 98 kg   SpO2 99%   BMI 30.13 kg/m  Vital signs in last 24 hours: Temp:  [98.1 F (36.7 C)-98.2 F (36.8 C)] 98.2 F (36.8 C) (02/09 1946) Pulse Rate:  [61-71] 71 (02/09 1946) Resp:  [14-20] 20 (02/09 1946) BP: (133-156)/(68-86) 156/68 (02/09 1946) SpO2:  [97 %-99 %] 99 % (02/09 1946) Weight:  [98 kg] 98 kg (02/09 1111)   Physical Exam  Constitutional: Appears well-developed and well-nourished.  Psych: Affect appropriate to situation Eyes: No scleral injection HENT: No OP obstruction, edentulous MSK: no joint deformities.  Cardiovascular: Normal rate and regular rhythm.  Respiratory: Effort normal, non-labored breathing GI: Soft.  No distension. There is no tenderness.  Skin: Partially healed abrasion of the right knee  Neuro: Mental Status: Patient is awake, alert, able to report his birthdate but not the current year a month or situation No signs of aphasia or neglect within limits of his hearing loss Cranial Nerves: II: Visual Fields are full. Pupils are equal, round, and reactive to light.   III,IV, VI: EOMI without ptosis or diploplia.  V: Facial sensation is symmetric to temperature VII: Facial movement is notable for a slight left facial droop at rest improved with activation VIII: hearing is extremely hard of hearing at baseline without his hearing aids the exam is very challenging X: Uvula elevates symmetrically XI: Shoulder shrug is symmetric. XII: tongue is midline without atrophy or fasciculations.  Motor: Tone is normal. Bulk is normal. 5/5 strength was present in all four extremities, except for some  weakness of the left upper extremity distally 3/5, proximally 4/5 Sensory: Sensation is symmetric to light touch and temperature in the arms and legs. Deep Tendon Reflexes: 2+ and symmetric in the biceps and patellae, fairly brisk throughout Plantars: Toes are downgoing bilaterally.  Cerebellar: Finger-nose is intact bilaterally, he has significant difficulty understanding heel-to-shin command despite multiple different attempts to elicit this maneuver  NIHSS total 1 Score breakdown: Left upper extremity drift (technically also did not answer month or age correctly, but this is baseline deficit)    I have reviewed labs in epic and the results pertinent to this consultation are:  Basic Metabolic Panel: Recent Labs  Lab 09/21/20 1155 09/22/20 0437  NA 140 143  K 4.4 3.7  CL 106 107  CO2 25 27  GLUCOSE 125* 113*  BUN 11 14  CREATININE 1.04 1.18  CALCIUM 9.1 8.8*    CBC: Recent Labs  Lab 09/21/20 1155  WBC 6.0  NEUTROABS 4.2  HGB 13.4  HCT 40.8  MCV 92.7  PLT 105*    Coagulation Studies: Recent Labs  09/21/20 1155  LABPROT 13.9  INR 1.1    Lab Results  Component Value Date   CHOL 170 09/22/2020   HDL 39 (L) 09/22/2020   LDLCALC 107 (H) 09/22/2020   TRIG 118 09/22/2020   CHOLHDL 4.4 09/22/2020   Lab Results  Component Value Date   HGBA1C 5.8 (H) 09/22/2020   No results found for: TSH   Repeat A1c this admission in process  I have reviewed the images obtained:   Personally reviewed MRI brain revealed acute infarct in the motor strip just inferior to the hand knob (key image below). Chronic cerebellar infarct, bilateral occipital infarcts left greater than right, and significant chronic white matter disease Personally reviewed MRA, left dominant vertebral artery with right vertebral artery poorly visualized, possibly ending in PICA Some narrowing of the left M1 MCA, as well as bilateral PCAs in the P2 segments   Echocardiogram completed  09/21/2020 1. Left ventricular ejection fraction, by estimation, is 60 to 65%. The  left ventricle has normal function. The left ventricle has no regional  wall motion abnormalities. Left ventricular diastolic parameters are  consistent with Grade III diastolic  dysfunction (restrictive).  2. Right ventricular systolic function is normal. The right ventricular  size is normal.  3. The mitral valve is normal in structure. No evidence of mitral valve  regurgitation.  4. The aortic valve is grossly normal. Aortic valve regurgitation is not  visualized.  Normal biatrial sizes  Impression: This is an 87 year old male with past medical history significant for vascular risk factors as above, presenting with a new stroke.  Given his significant small vessel risk factors, small vessel disease is certainly possibility although the location is also concerning for potential embolic etiology.  If cardiac monitoring in the future does show atrial fibrillation, he will need to have an extended discussion risk/benefit given his prior traumatic subdural hemorrhage in the setting of dual antiplatelet use and continued falls.  Nevertheless the information will help guide goals of care discussion, especially if he does have a larger stroke in the future.  Recommendations:  # Right MCA motor strip small stroke, small vessel disease versus potentially embolic of unknown source - Stroke labs HgbA1c, fasting lipid panel (current A1c 5.8%, from prior of 13.2 in 2019), current LDL 107 - MRI brain completed as above - MRA of the brain without contrast completed as above - Carotid dopplers, read pending - Echocardiogram completed and notable for grade 3 diastolic dysfunction with normal biatrial sizes - Event monitor on discharge, appreciate cardiology assistance - Prophylactic therapy-Antiplatelet med: Aspirin - dose 325mg  PO or 300mg  PR, followed by 81 mg daily - Plavix 300 mg load with 75 mg daily for 21 day  course  - Atorvastatin 40 mg nightly given elevated LDL above goal of 70 (recent study does suggest benefit even in older patients) - Risk factor modification - Telemetry monitoring; 30 day event monitor on discharge if no arrythmias captured  - Blood pressure goal   - Normotension - PT consult, OT consult, Speech consult,  - Outpatient neurology follow-up, patient's son is deciding whether he would prefer to follow-up at [facility] where the patient has been seen before versus [facility] clinic here  [facility] MD-PhD [facility] [phone]

## 2020-09-21 NOTE — ED Triage Notes (Addendum)
Pt to ER via [facility] from [facility].   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   Pt with hx of dementia, denies any symptoms now but is holding his arm.   EMS VSS- bp 178/92, cbg 140, HR nsr 78, temp 98.5

## 2020-09-21 NOTE — ED Notes (Signed)
Pt unable to ambulate to toilet with 1 assist. Pt noted to be dragging left leg while attempting ambulate. Pt unable to unbutton pants and states that he feels as if this left arm is "dead."

## 2020-09-22 ENCOUNTER — Observation Stay: Payer: Medicare Other

## 2020-09-22 ENCOUNTER — Observation Stay
Admit: 2020-09-22 | Discharge: 2020-09-22 | Disposition: A | Discharge status: Home / Self Care | Payer: Medicare Other | Attending: Physician Assistant | Admitting: Physician Assistant

## 2020-09-22 DIAGNOSIS — R29898 Other symptoms and signs involving the musculoskeletal system: Secondary | ICD-10-CM | POA: Diagnosis not present

## 2020-09-22 DIAGNOSIS — I10 Essential (primary) hypertension: Secondary | ICD-10-CM | POA: Diagnosis not present

## 2020-09-22 DIAGNOSIS — I639 Cerebral infarction, unspecified: Secondary | ICD-10-CM

## 2020-09-22 LAB — ECHOCARDIOGRAM COMPLETE
AR max vel: 1.88 cm2
AV Area VTI: 2.01 cm2
AV Area mean vel: 1.93 cm2
AV Mean grad: 8.7 mmHg
AV Peak grad: 15 mmHg
Ao pk vel: 1.93 m/s
Area-P 1/2: 2.62 cm2
Height: 71 in
S' Lateral: 2.9 cm
Weight: 3456.81 [oz_av]

## 2020-09-22 LAB — LIPID PANEL
Cholesterol: 170 mg/dL (ref 0–200)
HDL: 39 mg/dL — ABNORMAL LOW (ref >40)
LDL Cholesterol: 107 mg/dL — ABNORMAL HIGH (ref 0–99)
Total CHOL/HDL Ratio: 4.4 RATIO
Triglycerides: 118 mg/dL (ref <150)
VLDL: 24 mg/dL (ref 0–40)

## 2020-09-22 LAB — GLUCOSE, CAPILLARY
Glucose-Capillary: 120 mg/dL — ABNORMAL HIGH (ref 70–99)
Glucose-Capillary: 178 mg/dL — ABNORMAL HIGH (ref 70–99)
Glucose-Capillary: 90 mg/dL (ref 70–99)
Glucose-Capillary: 113 mg/dL — ABNORMAL HIGH (ref 70–99)

## 2020-09-22 LAB — HEMOGLOBIN A1C
Hgb A1c MFr Bld: 5.8 % — ABNORMAL HIGH (ref 4.8–5.6)
Mean Plasma Glucose: 119.76 mg/dL

## 2020-09-22 LAB — CBC
HCT: 38.2 % — ABNORMAL LOW (ref 39.0–52.0)
Hemoglobin: 12.2 g/dL — ABNORMAL LOW (ref 13.0–17.0)
MCH: 30 pg (ref 26.0–34.0)
MCHC: 31.9 g/dL (ref 30.0–36.0)
MCV: 94.1 fL (ref 80.0–100.0)
Platelets: 84 10*3/uL — ABNORMAL LOW (ref 150–400)
RBC: 4.06 MIL/uL — ABNORMAL LOW (ref 4.22–5.81)
RDW: 14.8 % (ref 11.5–15.5)
WBC: 5.4 10*3/uL (ref 4.0–10.5)
nRBC: 0 % (ref 0.0–0.2)

## 2020-09-22 LAB — BASIC METABOLIC PANEL WITH GFR
Anion gap: 9 (ref 5–15)
BUN: 14 mg/dL (ref 8–23)
CO2: 27 mmol/L (ref 22–32)
Calcium: 8.8 mg/dL — ABNORMAL LOW (ref 8.9–10.3)
Chloride: 107 mmol/L (ref 98–111)
Creatinine, Ser: 1.18 mg/dL (ref 0.61–1.24)
GFR, Estimated: 59 mL/min — ABNORMAL LOW
Glucose, Bld: 113 mg/dL — ABNORMAL HIGH (ref 70–99)
Potassium: 3.7 mmol/L (ref 3.5–5.1)
Sodium: 143 mmol/L (ref 135–145)

## 2020-09-22 LAB — SARS CORONAVIRUS 2 (TAT 6-24 HRS): SARS Coronavirus 2: NEGATIVE

## 2020-09-22 MED ORDER — CLOPIDOGREL BISULFATE 75 MG PO TABS
75.0000 mg | ORAL_TABLET | Freq: Every day | ORAL | Status: DC
  Filled 2020-09-22: qty 1

## 2020-09-22 MED ORDER — ENOXAPARIN SODIUM 60 MG/0.6ML ~~LOC~~ SOLN
0.5000 mg/kg | SUBCUTANEOUS | Status: DC
  Administered 2020-09-22: 22:00:00 50 mg via SUBCUTANEOUS
  Filled 2020-09-22: qty 0.6

## 2020-09-22 MED ORDER — CLOPIDOGREL BISULFATE 75 MG PO TABS
300.0000 mg | ORAL_TABLET | Freq: Once | ORAL | Status: AC
  Administered 2020-09-22: 300 mg via ORAL
  Filled 2020-09-22: qty 4

## 2020-09-22 NOTE — Progress Notes (Signed)
PHARMACIST - PHYSICIAN COMMUNICATION  CONCERNING:  Enoxaparin (Lovenox) for DVT Prophylaxis    RECOMMENDATION: Patient was prescribed enoxaprin 40mg  q24 hours for VTE prophylaxis.   Filed Weights   09/21/20 1111  Weight: 98 kg (216 lb 0.8 oz)    Body mass index is 30.13 kg/m.  Estimated Creatinine Clearance: 51.7 mL/min (by C-G formula based on SCr of 1.18 mg/dL).   Based on [facility] policy patient is candidate for enoxaparin 0.5mg /kg TBW SQ every 24 hours based on BMI being >30.   DESCRIPTION: Pharmacy has adjusted enoxaparin dose per [facility] policy.  Patient is now receiving enoxaparin 50 mg every 24 hours    [facility], PharmD, BCPS Clinical Pharmacist 09/22/2020 11:21 AM

## 2020-09-22 NOTE — Progress Notes (Addendum)
SLP Cancellation Note  Patient Details Name: Paul Martinez MRN: [id number] DOB: [birth year]   Cancelled treatment:       Reason Eval/Treat Not Completed: Patient at procedure or test/unavailable. First attempt, Pt was off unit for testing. Second attempt, pt was with nursing for bathing. Will continue efforts.   Leland Staszewski B. Murvin Natal [facility], CCC-SLP Speech Language Pathologist [phone]  Paul Martinez 09/22/2020, 9:30 AM, 11:00 AM

## 2020-09-22 NOTE — Progress Notes (Signed)
PROGRESS NOTE    IVER MELLEY  [facility]:[id number] DOB: 1929-08-16 DOA: 09/21/2020 PCP: Barbette Reichmann, MD   Brief Narrative:  SRIRAM RHINEHART is a 87 y.o. male with medical history significant of dementia, hypertension, diabetes mellitus, GERD, depression, ICH, thrombocytopenia, who presents with left arm weakness. Per his son, patient developed left arm weakness when he was doing physical therapy in ALF this AM, at about 10:00 to 10:30. In ED - CT head is negative for acute intracranial abnormalities.  MRI shows acute ischemic nonhemorrhagic right MCA, neurology consulted, PT following.  Hospitalist called for admission.  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  Patient taking glipizide and Metformin at home  Hypertension: Permissive hypertension Resume lisinopril once cleared per neurology  GERD (gastroesophageal reflux disease) -Protonix  Depression: -Continue home Zoloft  Chronic thrombocytopenia (HCC):  Platelets low, but appear to be at baseline  Dementia (HCC) -Donepezil   DVT prophylaxis: Lovenox Code Status: DNR Family  Communication: Son updated at bedside at length about prognosis, disposition planning and current medical diagnoses.  Status is: Inpatient  Dispo: The patient is from: Facility              Anticipated d/c is to: Same              Anticipated d/c date is: 24 hours              Patient currently not medically stable for discharge  Consultants:   Neurology  Procedures:   None  Antimicrobials:  None  Subjective: No acute issues or events overnight denies nausea vomiting diarrhea constipation headache fevers or chills  Objective: Vitals:   09/21/20 1734 09/21/20 1946 09/21/20 2343 09/22/20 0501  BP: (!) 142/70 (!) 156/68 113/68 (!) 157/74  Pulse: 64 71 88 66  Resp: 18 20 20 20   Temp: 98.2 F (36.8 C) 98.2 F (36.8 C) 97.7 F (36.5 C) 97.8 F (36.6 C)  TempSrc: Oral     SpO2: 99% 99% 95% 98%  Weight:      Height:       No intake or output data in the 24 hours ending 09/22/20 0756 Filed Weights   09/21/20 1111  Weight: 98 kg    Examination:  General exam: Appears calm and comfortable  Respiratory system: Clear to auscultation. Respiratory effort normal. Cardiovascular system: S1 & S2 heard, RRR. No JVD, murmurs, rubs, gallops or clicks. No pedal edema. Gastrointestinal system: Abdomen is nondistended, soft and nontender. No organomegaly or masses felt. Normal bowel sounds heard. Central nervous system: Alert and oriented. No focal neurological deficits. Extremities: Symmetric 5 x 5 power. Skin: No rashes, lesions or ulcers  Data Reviewed: I have personally reviewed following labs and imaging studies  CBC: Recent Labs  Lab 09/21/20 1155  WBC 6.0  NEUTROABS 4.2  HGB 13.4  HCT 40.8  MCV 92.7  PLT 105*   Basic Metabolic Panel: Recent Labs  Lab 09/21/20 1155 09/22/20 0437  NA 140 143  K 4.4 3.7  CL 106 107  CO2 25 27  GLUCOSE 125* 113*  BUN 11 14  CREATININE 1.04 1.18  CALCIUM 9.1 8.8*   GFR: Estimated Creatinine Clearance: 51.7 mL/min (by C-G  formula based on SCr of 1.18 mg/dL). Liver Function Tests: Recent Labs  Lab 09/21/20 1155  AST 27  ALT 20  ALKPHOS 108  BILITOT 1.5*  PROT 7.1  ALBUMIN 3.8   No results for input(s): LIPASE, AMYLASE in the last 168 hours. No results for input(s): AMMONIA in the last 168 hours. Coagulation Profile: Recent Labs  Lab 09/21/20 1155  INR 1.1   Cardiac Enzymes: No results for input(s): CKTOTAL, CKMB, CKMBINDEX, TROPONINI in the last 168 hours. BNP (last 3 results) No results for input(s): PROBNP in the last 8760 hours. HbA1C: No results for input(s): HGBA1C in the last 72 hours. CBG: Recent Labs  Lab 09/21/20 1758 09/21/20 2022  GLUCAP 105* 190*   Lipid Profile: Recent Labs    09/22/20 0437  CHOL 170  HDL 39*  LDLCALC 107*  TRIG 118  CHOLHDL 4.4   Thyroid Function Tests: No results for input(s): TSH, T4TOTAL, FREET4, T3FREE, THYROIDAB in the last 72 hours. Anemia Panel: No results for input(s): VITAMINB12, FOLATE, FERRITIN, TIBC, IRON, RETICCTPCT in the last 72 hours. Sepsis Labs: No results for input(s): PROCALCITON, LATICACIDVEN in the last 168 hours.  Recent Results (from the past 240 hour(s))  SARS CORONAVIRUS 2 (TAT 6-24 HRS) Nasopharyngeal Nasopharyngeal Swab     Status: None   Collection Time: 09/21/20 12:30 PM   Specimen: Nasopharyngeal Swab  Result Value Ref Range Status   SARS Coronavirus 2 NEGATIVE NEGATIVE Final    Comment: (NOTE) SARS-CoV-2 target nucleic acids are NOT DETECTED.  The SARS-CoV-2 RNA is generally detectable in upper and lower respiratory specimens during the acute phase of infection. Negative results do not preclude SARS-CoV-2 infection, do not rule out co-infections with other pathogens, and should not be used as the sole basis for treatment or other patient management decisions. Negative results must be combined with clinical observations, patient history, and epidemiological information. The expected result is  Negative.  Fact Sheet for Patients: HairSlick.no  Fact Sheet for Healthcare Providers: quierodirigir.com  This test is not yet approved or cleared by the [place] FDA and  has been authorized for detection and/or diagnosis of SARS-CoV-2 by FDA under an Emergency Use Authorization (EUA). This EUA will remain  in effect (meaning this test can be used) for the duration of the COVID-19 declaration under Se ction 564(b)(1) of the Act, 21 U.S.C. section 360bbb-3(b)(1), unless the authorization is terminated or revoked sooner.  Performed at [facility], [address], [place], [place] [address]          Radiology Studies: CT Head Wo Contrast  Result Date: 09/21/2020 CLINICAL DATA:  Fall left arm weakness. EXAM: CT HEAD WITHOUT CONTRAST TECHNIQUE: Contiguous axial images were obtained from the base of the skull through the vertex without intravenous contrast. COMPARISON:  CT head dated September 30, 2017. FINDINGS: Brain: No evidence of acute infarction, hemorrhage, hydrocephalus, extra-axial collection or mass lesion/mass effect. 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 Stable atrophy. Progressive microvascular ischemic changes. Vascular:  Calcified atherosclerosis at the skullbase. No hyperdense vessel. Skull: Normal. Negative for fracture or focal lesion. Sinuses/Orbits: No acute finding. Other: None. IMPRESSION: 1. No acute intracranial abnormality. 2. Progressive chronic microvascular ischemic changes. Electronically Signed   By: Obie Dredge M.D.   On: 09/21/2020 12:36   MR ANGIO HEAD WO CONTRAST  Result Date: 09/21/2020 CLINICAL DATA:  Initial evaluation for acute stroke. History of dementia. EXAM: MRI HEAD WITHOUT CONTRAST MRA HEAD WITHOUT CONTRAST TECHNIQUE: Multiplanar, multiecho pulse sequences of the brain and surrounding structures were obtained without intravenous contrast.  Angiographic images of the head were obtained using MRA technique without contrast. COMPARISON:  Prior head CT from earlier the same day. FINDINGS: MRI HEAD FINDINGS Brain: Examination somewhat limited as the patient was unable to tolerate the full length of the exam. Coronal T2 and SWI sequences were not able to be obtained. Diffuse prominence of the CSF containing spaces compatible with generalized cerebral atrophy, moderately advanced in nature. Patchy and confluent T2/FLAIR hyperintensity involving the periventricular deep white matter both cerebral hemispheres most consistent with chronic small vessel ischemic disease, fairly advanced in nature. Few scatter remote lacunar infarcts noted about the deep gray nuclei and hemispheric cerebral white matter. Patchy involvement of the midbrain noted. Small remote bilateral PCA territory infarcts involving the occipital lobes noted, left slightly larger than right. Remote bilateral cerebellar infarcts noted as well, also greater on the left. 2 cm linear focus of restricted diffusion involving the cortical and subcortical aspect of the posterior right frontal lobe, consistent with an acute right MCA distribution infarct (series 7, image 17). Involvement of the precentral gyrus. No associated mass effect. No definite associated hemorrhage, although evaluation somewhat limited by lack of gradient echo sequence. No visible hemorrhage on prior CT. No other evidence for acute or subacute ischemia. Gray-white matter differentiation otherwise maintained. No mass lesion, midline shift or mass effect. Diffuse ventricular prominence related to global parenchymal volume loss without hydrocephalus. No extra-axial fluid collection. Pituitary gland suprasellar region within normal limits. Midline structures intact. Vascular: Abnormal flow void within the right vertebral artery, which could be related to slow flow and/or occlusion. Major intracranial vascular flow voids are otherwise  maintained. Skull and upper cervical spine: Craniocervical junction within normal limits. Bone marrow signal intensity normal. No scalp soft tissue abnormality. Sinuses/Orbits: Patient status post bilateral ocular lens replacement. Globes and orbital soft tissues demonstrate no acute finding. Paranasal sinuses are largely clear. Small left greater than right mastoid effusions, of doubtful significance. Inner ear structures grossly normal. Visualized nasopharynx within normal limits. Other: None. MRA HEAD FINDINGS ANTERIOR CIRCULATION: Visualized distal cervical segments of the internal carotid arteries are patent with antegrade flow. Petrous, cavernous, and supraclinoid segments widely patent bilaterally. A1 segments patent. Normal anterior communicating artery complex. Anterior cerebral arteries patent to their distal aspects without stenosis. Short-segment mild atheromatous stenosis at the mid left M1 segment. Right M1 widely patent. Normal MCA bifurcations. Distal MCA branches well perfused and fairly symmetric. POSTERIOR CIRCULATION: Left vertebral artery widely patent to the vertebrobasilar junction. Left PICA origin patent and normal. Right vertebral artery largely not visualized, likely occluded. Scant retrograde flow into the distal right V4 segment. Basilar widely patent to its distal aspect. Superior cerebellar arteries patent bilaterally. Both PCAs primarily supplied via the basilar. Short-segment moderate proximal right P2 stenosis (series 1059, image 11). Additional short-segment moderate distal left P2 stenosis (series 1051, image 6). PCAs otherwise patent to their distal aspects. No intracranial aneurysm. IMPRESSION: MRI HEAD IMPRESSION: 1. Technically limited  exam due to the patient's inability to tolerate the full length of the study. 2. 2 cm acute ischemic nonhemorrhagic right MCA distribution infarct involving the posterior right frontal lobe. No associated mass effect. 3. Underlying moderately  advanced cerebral atrophy with chronic microvascular ischemic disease, with multiple remote bilateral PCA and cerebellar infarcts as above. MRA HEAD IMPRESSION: 1. Chronic occlusion of the right vertebral artery. Dominant left vertebral artery widely patent. 2. Short-segment moderate bilateral P2 stenoses as above. 3. Otherwise negative intracranial MRA, with no large vessel occlusion or hemodynamically significant stenosis. Electronically Signed   By: Rise Mu M.D.   On: 09/21/2020 17:12   MR BRAIN WO CONTRAST  Result Date: 09/21/2020 CLINICAL DATA:  Initial evaluation for acute stroke. History of dementia. EXAM: MRI HEAD WITHOUT CONTRAST MRA HEAD WITHOUT CONTRAST TECHNIQUE: Multiplanar, multiecho pulse sequences of the brain and surrounding structures were obtained without intravenous contrast. Angiographic images of the head were obtained using MRA technique without contrast. COMPARISON:  Prior head CT from earlier the same day. FINDINGS: MRI HEAD FINDINGS Brain: Examination somewhat limited as the patient was unable to tolerate the full length of the exam. Coronal T2 and SWI sequences were not able to be obtained. Diffuse prominence of the CSF containing spaces compatible with generalized cerebral atrophy, moderately advanced in nature. Patchy and confluent T2/FLAIR hyperintensity involving the periventricular deep white matter both cerebral hemispheres most consistent with chronic small vessel ischemic disease, fairly advanced in nature. Few scatter remote lacunar infarcts noted about the deep gray nuclei and hemispheric cerebral white matter. Patchy involvement of the midbrain noted. Small remote bilateral PCA territory infarcts involving the occipital lobes noted, left slightly larger than right. Remote bilateral cerebellar infarcts noted as well, also greater on the left. 2 cm linear focus of restricted diffusion involving the cortical and subcortical aspect of the posterior right frontal  lobe, consistent with an acute right MCA distribution infarct (series 7, image 17). Involvement of the precentral gyrus. No associated mass effect. No definite associated hemorrhage, although evaluation somewhat limited by lack of gradient echo sequence. No visible hemorrhage on prior CT. No other evidence for acute or subacute ischemia. Gray-white matter differentiation otherwise maintained. No mass lesion, midline shift or mass effect. Diffuse ventricular prominence related to global parenchymal volume loss without hydrocephalus. No extra-axial fluid collection. Pituitary gland suprasellar region within normal limits. Midline structures intact. Vascular: Abnormal flow void within the right vertebral artery, which could be related to slow flow and/or occlusion. Major intracranial vascular flow voids are otherwise maintained. Skull and upper cervical spine: Craniocervical junction within normal limits. Bone marrow signal intensity normal. No scalp soft tissue abnormality. Sinuses/Orbits: Patient status post bilateral ocular lens replacement. Globes and orbital soft tissues demonstrate no acute finding. Paranasal sinuses are largely clear. Small left greater than right mastoid effusions, of doubtful significance. Inner ear structures grossly normal. Visualized nasopharynx within normal limits. Other: None. MRA HEAD FINDINGS ANTERIOR CIRCULATION: Visualized distal cervical segments of the internal carotid arteries are patent with antegrade flow. Petrous, cavernous, and supraclinoid segments widely patent bilaterally. A1 segments patent. Normal anterior communicating artery complex. Anterior cerebral arteries patent to their distal aspects without stenosis. Short-segment mild atheromatous stenosis at the mid left M1 segment. Right M1 widely patent. Normal MCA bifurcations. Distal MCA branches well perfused and fairly symmetric. POSTERIOR CIRCULATION: Left vertebral artery widely patent to the vertebrobasilar junction.  Left PICA origin patent and normal. Right vertebral artery largely not visualized, likely occluded. Scant retrograde flow into the distal  right V4 segment. Basilar widely patent to its distal aspect. Superior cerebellar arteries patent bilaterally. Both PCAs primarily supplied via the basilar. Short-segment moderate proximal right P2 stenosis (series 1059, image 11). Additional short-segment moderate distal left P2 stenosis (series 1051, image 6). PCAs otherwise patent to their distal aspects. No intracranial aneurysm. IMPRESSION: MRI HEAD IMPRESSION: 1. Technically limited exam due to the patient's inability to tolerate the full length of the study. 2. 2 cm acute ischemic nonhemorrhagic right MCA distribution infarct involving the posterior right frontal lobe. No associated mass effect. 3. Underlying moderately advanced cerebral atrophy with chronic microvascular ischemic disease, with multiple remote bilateral PCA and cerebellar infarcts as above. MRA HEAD IMPRESSION: 1. Chronic occlusion of the right vertebral artery. Dominant left vertebral artery widely patent. 2. Short-segment moderate bilateral P2 stenoses as above. 3. Otherwise negative intracranial MRA, with no large vessel occlusion or hemodynamically significant stenosis. Electronically Signed   By: Rise MuBenjamin  McClintock M.D.   On: 09/21/2020 17:12   ECHOCARDIOGRAM COMPLETE  Result Date: 09/22/2020    ECHOCARDIOGRAM REPORT   Patient Name:   Alice RiegerWILLIAM H Chandley Date of Exam: 09/21/2020 Medical Rec #:  [id number]       Height:       71.0 in Accession #:    [id number]      Weight:       216.0 lb Date of Birth:  August 23, 1929       BSA:          2.179 m Patient Age:    89 years        BP:           144/80 mmHg Patient Gender: M               HR:           64 bpm. Exam Location:  [facility] Procedure: 2D Echo, Cardiac Doppler and Color Doppler Indications:     I163.9 Stroke  History:         Patient has no prior history of Echocardiogram examinations.                  Risk  Factors:Hypertension and Diabetes.  Sonographer:     Sedonia SmallNaTashia Rodgers-Jones Referring Phys:  [id number] XILIN NIU Diagnosing Phys: Alwyn Peawayne D Callwood MD IMPRESSIONS  1. Left ventricular ejection fraction, by estimation, is 60 to 65%. The left ventricle has normal function. The left ventricle has no regional wall motion abnormalities. Left ventricular diastolic parameters are consistent with Grade III diastolic dysfunction (restrictive).  2. Right ventricular systolic function is normal. The right ventricular size is normal.  3. The mitral valve is normal in structure. No evidence of mitral valve regurgitation.  4. The aortic valve is grossly normal. Aortic valve regurgitation is not visualized. FINDINGS  Left Ventricle: Left ventricular ejection fraction, by estimation, is 60 to 65%. The left ventricle has normal function. The left ventricle has no regional wall motion abnormalities. The left ventricular internal cavity size was normal in size. There is  no left ventricular hypertrophy. Left ventricular diastolic parameters are consistent with Grade III diastolic dysfunction (restrictive). Right Ventricle: The right ventricular size is normal. No increase in right ventricular wall thickness. Right ventricular systolic function is normal. Left Atrium: Left atrial size was normal in size. Right Atrium: Right atrial size was normal in size. Pericardium: There is no evidence of pericardial effusion. Mitral Valve: The mitral valve is normal in structure. No evidence of mitral valve regurgitation. Tricuspid Valve: The tricuspid  valve is normal in structure. Tricuspid valve regurgitation is not demonstrated. Aortic Valve: The aortic valve is grossly normal. Aortic valve regurgitation is not visualized. Aortic valve mean gradient measures 8.7 mmHg. Aortic valve peak gradient measures 15.0 mmHg. Aortic valve area, by VTI measures 2.01 cm. Pulmonic Valve: The pulmonic valve was normal in structure. Pulmonic valve regurgitation is  not visualized. Aorta: The ascending aorta was not well visualized. IAS/Shunts: No atrial level shunt detected by color flow Doppler.  LEFT VENTRICLE PLAX 2D LVIDd:         4.47 cm  Diastology LVIDs:         2.90 cm  LV e' medial:    6.53 cm/s LV PW:         0.79 cm  LV E/e' medial:  11.4 LV IVS:        0.74 cm  LV e' lateral:   9.25 cm/s LVOT diam:     2.00 cm  LV E/e' lateral: 8.1 LV SV:         82 LV SV Index:   38 LVOT Area:     3.14 cm  RIGHT VENTRICLE RV Basal diam:  3.51 cm RV S prime:     18.00 cm/s TAPSE (M-mode): 2.2 cm LEFT ATRIUM             Index       RIGHT ATRIUM           Index LA diam:        4.50 cm 2.07 cm/m  RA Area:     13.50 cm LA Vol (A2C):   35.1 ml 16.11 ml/m RA Volume:   32.60 ml  14.96 ml/m LA Vol (A4C):   40.8 ml 18.73 ml/m LA Biplane Vol: 39.3 ml 18.04 ml/m  AORTIC VALVE AV Area (Vmax):    1.88 cm AV Area (Vmean):   1.93 cm AV Area (VTI):     2.01 cm AV Vmax:           193.33 cm/s AV Vmean:          139.333 cm/s AV VTI:            0.407 m AV Peak Grad:      15.0 mmHg AV Mean Grad:      8.7 mmHg LVOT Vmax:         115.50 cm/s LVOT Vmean:        85.550 cm/s LVOT VTI:          0.260 m LVOT/AV VTI ratio: 0.64  AORTA Ao Root diam: 3.60 cm MITRAL VALVE MV Area (PHT): 2.62 cm     SHUNTS MV Decel Time: 289 msec     Systemic VTI:  0.26 m MV E velocity: 74.70 cm/s   Systemic Diam: 2.00 cm MV A velocity: 122.00 cm/s MV E/A ratio:  0.61 Dwayne D Callwood MD Electronically signed by Alwyn Pea MD Signature Date/Time: 09/22/2020/7:11:38 AM    Final         Scheduled Meds: . aspirin  300 mg Rectal Daily   Or  . aspirin  325 mg Oral Daily  . atorvastatin  40 mg Oral Daily  . donepezil  10 mg Oral QHS  . enoxaparin (LOVENOX) injection  40 mg Subcutaneous Q24H  . insulin aspart  0-5 Units Subcutaneous QHS  . insulin aspart  0-9 Units Subcutaneous TID WC  . iron polysaccharides  150 mg Oral Daily  . multivitamin with minerals  1 tablet Oral Daily  .  pantoprazole  20 mg  Oral Daily  . sertraline  100 mg Oral Daily  . vitamin B-12  1,000 mcg Oral Daily   Continuous Infusions:   LOS: 0 days    Time spent:  Azucena Fallen, DO [facility]  If 7PM-7AM, please contact night-coverage [email or web address]  09/22/2020, 7:56 AM

## 2020-09-22 NOTE — TOC Initial Note (Signed)
Transition of Care [facility]) - Initial/Assessment Note    Patient Details  Name: Paul Martinez MRN: [id number] Date of Birth: 11/08/1929  Transition of Care [facility]) CM/SW Contact:    Allayne Butcher, RN Phone Number: 09/22/2020, 1:49 PM  Clinical Narrative:                 Patient placed under observation for stroke.  Patient is from [facility].  RNCM attempted to reach patient's son, Freida Busman, via phone but no answer and voicemail is full.  RNCM did speak with Lupita Leash at [facility].  Lupita Leash reports that she is good with the patient returning at discharge and they have therapy in house.  PT is recommending SNF, Lupita Leash is aware that SNF recommended but they feel that they can still fulfill the patient's needs.   Expected Discharge Plan: Assisted Living Barriers to Discharge: Continued Medical Work up   Patient Goals and CMS Choice   CMS Medicare.gov Compare Post Acute Care list provided to:: Patient Represenative (must comment) Choice offered to / list presented to : Adult Children  Expected Discharge Plan and Services Expected Discharge Plan: Assisted Living   Discharge Planning Services: CM Consult   Living arrangements for the past 2 months: Assisted Living Facility                 DME Arranged: N/A                    Prior Living Arrangements/Services Living arrangements for the past 2 months: Assisted Living Facility Lives with:: Facility Resident Patient language and need for interpreter reviewed:: Yes Do you feel safe going back to the place where you live?: Yes      Need for Family Participation in Patient Care: Yes (Comment) (stroke) Care giver support system in place?: Yes (comment) (son) Current home services: Home PT,Home OT (Therapy at [facility]) Criminal Activity/Legal Involvement Pertinent to Current Situation/Hospitalization: No - Comment as needed  Activities of Daily Living Home Assistive Devices/Equipment: Environmental consultant  (specify type) ADL Screening (condition at time of admission) Patient's cognitive ability adequate to safely complete daily activities?: No Is the patient deaf or have difficulty hearing?: Yes Does the patient have difficulty seeing, even when wearing glasses/contacts?: No Does the patient have difficulty concentrating, remembering, or making decisions?: No Patient able to express need for assistance with ADLs?: Yes Does the patient have difficulty dressing or bathing?: Yes Independently performs ADLs?: Yes (appropriate for developmental age) Does the patient have difficulty walking or climbing stairs?: Yes Weakness of Legs: Left Weakness of Arms/Hands: Left  Permission Sought/Granted Permission sought to share information with : Case Manager,Family Electrical engineer Permission granted to share information with : Yes, Verbal Permission Granted  Share Information with NAME: Freida Busman  Permission granted to share info w AGENCY: [facility]  Permission granted to share info w Relationship: son     Emotional Assessment       Orientation: : Oriented to Self Alcohol / Substance Use: Not Applicable Psych Involvement: No (comment)  Admission diagnosis:  Left arm weakness [R29.898] Cerebrovascular accident (CVA), unspecified mechanism (HCC) [I63.9] Patient Active Problem List   Diagnosis Date Noted  . Left arm weakness 09/21/2020  . Dementia (HCC) 09/21/2020  . Stroke (HCC) 09/21/2020  . Diabetes mellitus without complication (HCC)   . Hypertension   . GERD (gastroesophageal reflux disease)   . Depression   . Thrombocytopenia (HCC)   . ICH (intracerebral hemorrhage) (HCC) 09/30/2017  .  DIZZINESS 02/23/2010  . DYSPNEA 02/23/2010  . ABNORMAL ELECTROCARDIOGRAM 02/23/2010   PCP:  Barbette Reichmann, MD Pharmacy:   [facility], [place] - [address] [place] [address] Phone: [phone] Fax: [phone]     Social  Determinants of Health (SDOH) Interventions    Readmission Risk Interventions No flowsheet data found.

## 2020-09-22 NOTE — Evaluation (Signed)
Occupational Therapy Evaluation Patient Details Name: Paul Martinez MRN: [id number] DOB: 05/27/1930 Today's Date: 09/22/2020    History of Present Illness Pt is an 87 y.o. male with medical history significant of dementia, hypertension, diabetes mellitus, GERD, depression, ICH, thrombocytopenia, who presents with left arm weakness.  MD assessment includes: Left arm weakness due to possible stroke with head MRI impression "2 cm acute ischemic nonhemorrhagic right MCA distribution infarct involving the posterior right frontal lobe".   Clinical Impression   Pt seen for OT evaluation this date. No family/caregivers present and pt questionable historian. Per chart, in 2019 he was receiving assistance with medications and cooking as well as his finances, but showering and getting dressed without assistance. Mostly recently, pt noted to live in ALF. Pt alert and oriented to self only. Pleasant and agreeable to OT. Pt denies use of AD or need for assist at baseline for basic ADL (bathing, dressing, toileting), as well as reporting independence with medication mgt. Pt denies falls. Pt able to follow simple commands with occasional cues. Performed bed mobility with CGA and cues for sequencing. Once EOB, tolerated static and minimal dynamic sitting without assist/back support for >14min during LB ADL tasks, ultimately requiring MOD-MAX A for LB dressing 2/2 decreased balance and LUE strength/FMC deficits. Pt unaware why he is having trouble with his L hand and why he is here. Pt became tearful suddenly, reporting he "wasn't doing too good." Active listening and emotional support provided and pt appeared to smile and appeared less distressed. Pt demos impairments in BLE and LUE strength, balance, LUE FMC, and cognition which is impeding his ability to perform ADL mobility and ADL tasks without need for significant assist. No visual or speech deficits appreciated. Pt will benefit from skilled OT services to maximize  return to PLOF and minimize risk of fall, functional decline, caregiver burden, and need for higher level of care. Recommend SNF for additional therapy upon discharge.    Follow Up Recommendations  SNF    Equipment Recommendations  3 in 1 bedside commode    Recommendations for Other Services       Precautions / Restrictions Precautions Precautions: Fall Restrictions Weight Bearing Restrictions: No      Mobility Bed Mobility Overal bed mobility: Needs Assistance Bed Mobility: Supine to Sit;Sit to Supine     Supine to sit: Min guard;HOB elevated Sit to supine: Min guard   General bed mobility comments: Mod A for BLE and trunk control and to maintain static sitting at the EOB    Transfers Overall transfer level: Needs assistance Equipment used: None Transfers: Sit to/from Stand Sit to Stand: Mod assist;Max assist         General transfer comment: Mod A to come to standing and to prevent posterior LOB in standing    Balance Overall balance assessment: Needs assistance Sitting-balance support: Feet supported;Bilateral upper extremity supported Sitting balance-Leahy Scale: Fair Sitting balance - Comments: initially fair requiring supervision but with time pt fatigues and note intermittent posterior LOB requiring cues for pt to self correct Postural control: Posterior lean Standing balance support: Bilateral upper extremity supported;During functional activity Standing balance-Leahy Scale: Poor Standing balance comment: unable to maintain balance                           ADL either performed or assessed with clinical judgement   ADL Overall ADL's : Needs assistance/impaired  General ADL Comments: Pt required MOD A for donning socks seated EOB, requires MAX A for LB ADL in standing 2/2 decr balance and strength, MIN-MOD A for LUE involvement into bilat grooming tasks; MOD A for toilet transfers      Vision Baseline Vision/History: Wears glasses Wears Glasses: At all times (pt reports wearing "almost all the time" but not in room) Patient Visual Report: No change from baseline Vision Assessment?: No apparent visual deficits     Perception     Praxis      Pertinent Vitals/Pain Pain Assessment: No/denies pain     Hand Dominance Right   Extremity/Trunk Assessment Upper Extremity Assessment Upper Extremity Assessment: LUE deficits/detail RUE Deficits / Details: RUE strength grossly WFL LUE Deficits / Details: Grossly functional strength in the left elbow but poor hand/finger/grip strength, unable to sustain grip particularly when distracted; decreased FMC LUE Coordination: decreased fine motor;decreased gross motor   Lower Extremity Assessment Lower Extremity Assessment: Generalized weakness;RLE deficits/detail;LLE deficits/detail RLE Deficits / Details: General weakness but grossly equal L/R LLE Deficits / Details: General weakness but grossly equal L/R       Communication Communication Communication: HOH   Cognition Arousal/Alertness: Awake/alert Behavior During Therapy: WFL for tasks assessed/performed Overall Cognitive Status: History of cognitive impairments - at baseline                                 General Comments: At baseline per chart review; pt A&O to self only, stated "I live here"; able to follow 1-step commands with extra time and cuing   General Comments       Exercises Total Joint Exercises Ankle Circles/Pumps: AROM;Both;10 reps;AAROM Heel Slides: AAROM;Both;10 reps;Strengthening Hip ABduction/ADduction: AAROM;Strengthening;Both;10 reps Straight Leg Raises: AAROM;Strengthening;Both;10 reps Long Arc Quad: AROM;Strengthening;Both;10 reps Other Exercises Other Exercises: Anterior weight shifting activities in sitting and standing to address posterior lean Other Exercises: Seated EOB ADL tasks, static and dyn sitting balance, bed  mobility   Shoulder Instructions      Home Living Family/patient expects to be discharged to:: Assisted living   Available Help at Discharge: Skilled Nursing Facility Type of Home: Skilled Nursing Facility                           Additional Comments: Pt is a poor historian with history obtained from chart review      Prior Functioning/Environment Level of Independence: Needs assistance  Gait / Transfers Assistance Needed: pt denies use of AD for mobility  - no family/caregivers present to verify ADL's / Homemaking Assistance Needed: pt denies  assist for ADL or med mgt - no family/caregivers present to verify   Comments: pt denies falls        OT Problem List: Decreased strength;Decreased coordination;Decreased cognition;Decreased activity tolerance;Impaired balance (sitting and/or standing);Decreased knowledge of use of DME or AE;Impaired UE functional use      OT Treatment/Interventions: Self-care/ADL training;Therapeutic exercise;Therapeutic activities;Neuromuscular education;Cognitive remediation/compensation;Energy conservation;DME and/or AE instruction;Patient/family education;Balance training    OT Goals(Current goals can be found in the care plan section) Acute Rehab OT Goals Patient Stated Goal: get better OT Goal Formulation: With patient Time For Goal Achievement: 10/06/20 Potential to Achieve Goals: Good ADL Goals Pt Will Perform Lower Body Dressing: with mod assist;sit to/from stand Pt Will Transfer to Toilet: with min assist;bedside commode;ambulating (LRAD) Additional ADL Goal #1: Pt will perform seated bilat FMC ADL tasks wiht minimal  set up using B hands in task with MIN A for LUE incorporation  OT Frequency: Min 2X/week   Barriers to D/C:            Co-evaluation              AM-PAC OT "6 Clicks" Daily Activity     Outcome Measure Help from another person eating meals?: None Help from another person taking care of personal  grooming?: A Little Help from another person toileting, which includes using toliet, bedpan, or urinal?: A Lot Help from another person bathing (including washing, rinsing, drying)?: A Lot Help from another person to put on and taking off regular upper body clothing?: A Little Help from another person to put on and taking off regular lower body clothing?: A Lot 6 Click Score: 16   End of Session    Activity Tolerance: Patient tolerated treatment well Patient left: in bed;with bed alarm set;with call bell/phone within reach  OT Visit Diagnosis: Other abnormalities of gait and mobility (R26.89);Muscle weakness (generalized) (M62.81);Hemiplegia and hemiparesis;Other symptoms and signs involving cognitive function Hemiplegia - Right/Left: Left Hemiplegia - dominant/non-dominant: Non-Dominant Hemiplegia - caused by: Cerebral infarction                Time: 1443-1540 OT Time Calculation (min): 30 min Charges:  OT General Charges $OT Visit: 1 Visit OT Evaluation $OT Eval Moderate Complexity: 1 Mod OT Treatments $Self Care/Home Management : 23-37 mins  Wynona Canes, MPH, MS, OTR/L ascom [phone] 09/22/20, 3:36 PM

## 2020-09-22 NOTE — Progress Notes (Addendum)
OT Cancellation Note  Patient Details Name: Paul Martinez MRN: [id number] DOB: 1929-11-16   Cancelled Treatment:    Reason Eval/Treat Not Completed: Patient at procedure or test/ unavailable. Consult received, chart reviewed. Pt out for testing on 1st attempt. Will re-attempt at later time as pt is available and medically appropriate.   2nd attempt, pt with nursing for assessment. Will re-attempt this afternoon.   Wynona Canes, MPH, MS, OTR/L ascom [phone] 09/22/20, 9:07 AM

## 2020-09-22 NOTE — Progress Notes (Signed)
Attempted to call pt's son on both mobile and home phone to notify that pt fell. Was unable to leave voicemail due to cell phone voicemail box full, and no identified voicemail to leave message on home phone.  Will reattempt later.

## 2020-09-22 NOTE — Evaluation (Signed)
Physical Therapy Evaluation Patient Details Name: Paul Martinez MRN: [id number] DOB: 09-06-1929 Today's Date: 09/22/2020   History of Present Illness  Pt is an 87 y.o. male with medical history significant of dementia, hypertension, diabetes mellitus, GERD, depression, ICH, thrombocytopenia, who presents with left arm weakness.  MD assessment includes: Left arm weakness due to possible stroke with head MRI impression "2 cm acute ischemic nonhemorrhagic right MCA distribution infarct involving the posterior right frontal lobe".    Clinical Impression  Pt alert to self only but pleasant and followed 1-step commands well with extra time and cuing.  Pt required physical assistance with all functional tasks and presented with significant posterior lean in sitting and standing that improved with anterior weight shifting activities.  Pt was able to take several at the EOB and then from bed to chair but required assist to prevent posterior LOB.  Pt had difficulty maintaining LUE grip to the RW and required physical assist to place his hand on the handle several times.  Pt will benefit from PT services in a SNF setting upon discharge to safely address deficits listed in patient problem list for decreased caregiver assistance and eventual return to PLOF.     Follow Up Recommendations SNF    Equipment Recommendations  Other (comment) (TBD at next venue of care)    Recommendations for Other Services       Precautions / Restrictions Precautions Precautions: Fall Restrictions Weight Bearing Restrictions: No      Mobility  Bed Mobility Overal bed mobility: Needs Assistance Bed Mobility: Supine to Sit     Supine to sit: Mod assist     General bed mobility comments: Mod A for BLE and trunk control and to maintain static sitting at the EOB    Transfers Overall transfer level: Needs assistance Equipment used: Rolling walker (2 wheeled) Transfers: Sit to/from Stand Sit to Stand: Mod  assist         General transfer comment: Mod A to come to standing and to prevent posterior LOB in standing  Ambulation/Gait Ambulation/Gait assistance: Min assist;Mod assist Gait Distance (Feet): 3 Feet Assistive device: Rolling walker (2 wheeled) Gait Pattern/deviations: Step-to pattern;Decreased step length - right;Decreased step length - left Gait velocity: decreased   General Gait Details: Pt required assist to maintain LUE grip on the RW and required min-mod A for stability with limited ambulation at the EOB  Stairs            Wheelchair Mobility    Modified Rankin (Stroke Patients Only)       Balance Overall balance assessment: Needs assistance Sitting-balance support: Feet supported;Bilateral upper extremity supported Sitting balance-Leahy Scale: Poor   Postural control: Posterior lean Standing balance support: Bilateral upper extremity supported;During functional activity Standing balance-Leahy Scale: Poor Standing balance comment: Posterior instability                             Pertinent Vitals/Pain Pain Assessment: No/denies pain    Home Living Family/patient expects to be discharged to:: Assisted living   Available Help at Discharge: Skilled Nursing Facility Type of Home: Skilled Nursing Facility           Additional Comments: Pt is a poor historian with history obtained from chart review    Prior Function           Comments: Unkown, pt is a poor historian with no family present to assist     Hand Dominance  Dominant Hand: Right    Extremity/Trunk Assessment   Upper Extremity Assessment Upper Extremity Assessment: Generalized weakness;LUE deficits/detail;Difficult to assess due to impaired cognition;RUE deficits/detail RUE Deficits / Details: RUE strength grossly WFL LUE Deficits / Details: Grossly functional strength in the left elbow but poor hand/finger/grip strength LUE Coordination: decreased fine  motor;decreased gross motor    Lower Extremity Assessment Lower Extremity Assessment: Generalized weakness;RLE deficits/detail;LLE deficits/detail RLE Deficits / Details: General weakness but grossly equal L/R LLE Deficits / Details: General weakness but grossly equal L/R       Communication   Communication: HOH  Cognition Arousal/Alertness: Awake/alert Behavior During Therapy: WFL for tasks assessed/performed Overall Cognitive Status: History of cognitive impairments - at baseline                                 General Comments: At baseline per chart review; pt A&O to self only, stated "I live here"; able to follow 1-step commands with extra time and cuing      General Comments      Exercises Total Joint Exercises Ankle Circles/Pumps: AROM;Both;10 reps;AAROM Heel Slides: AAROM;Both;10 reps;Strengthening Hip ABduction/ADduction: AAROM;Strengthening;Both;10 reps Straight Leg Raises: AAROM;Strengthening;Both;10 reps Long Arc Quad: AROM;Strengthening;Both;10 reps Other Exercises Other Exercises: Anterior weight shifting activities in sitting and standing to address posterior lean   Assessment/Plan    PT Assessment Patient needs continued PT services  PT Problem List Decreased strength;Decreased activity tolerance;Decreased balance;Decreased mobility;Decreased knowledge of use of DME       PT Treatment Interventions DME instruction;Gait training;Functional mobility training;Therapeutic activities;Therapeutic exercise;Balance training;Patient/family education    PT Goals (Current goals can be found in the Care Plan section)  Acute Rehab PT Goals PT Goal Formulation: Patient unable to participate in goal setting Time For Goal Achievement: 10/05/20 Potential to Achieve Goals: Fair    Frequency 7X/week   Barriers to discharge Inaccessible home environment;Decreased caregiver support      Co-evaluation               AM-PAC PT "6 Clicks" Mobility   Outcome Measure Help needed turning from your back to your side while in a flat bed without using bedrails?: A Lot Help needed moving from lying on your back to sitting on the side of a flat bed without using bedrails?: A Lot Help needed moving to and from a bed to a chair (including a wheelchair)?: A Lot Help needed standing up from a chair using your arms (e.g., wheelchair or bedside chair)?: A Lot Help needed to walk in hospital room?: Total Help needed climbing 3-5 steps with a railing? : Total 6 Click Score: 10    End of Session Equipment Utilized During Treatment: Gait belt Activity Tolerance: Patient tolerated treatment well Patient left: in chair;with call bell/phone within reach;with chair alarm set;with nursing/sitter in room Nurse Communication: Mobility status PT Visit Diagnosis: Unsteadiness on feet (R26.81);Difficulty in walking, not elsewhere classified (R26.2);Muscle weakness (generalized) (M62.81);Hemiplegia and hemiparesis Hemiplegia - Right/Left: Left Hemiplegia - dominant/non-dominant: Non-dominant Hemiplegia - caused by: Cerebral infarction    Time: 3149-7026 PT Time Calculation (min) (ACUTE ONLY): 35 min   Charges:   PT Evaluation $PT Eval Moderate Complexity: 1 Mod PT Treatments $Therapeutic Exercise: 8-22 mins        D. Scott Lauren Modisette PT, DPT 09/22/20, 1:29 PM

## 2020-09-22 NOTE — Progress Notes (Signed)
Cross Cover Depressive symptoms reported by nursing with patient uncontrollably crying and making comments about wanting to be dead.  Nursing asked to monitor any further behaviors and will be watched closely with room in front of nursing station.   If behaviors/symptoms continue, will need psych consult, perhaps medication eval for change or additions to treat his dementia and depression

## 2020-09-22 NOTE — Evaluation (Signed)
Speech Language Pathology Evaluation Patient Details Name: Paul Martinez MRN: [id number] DOB: [birth year] Today's Date: 09/22/2020 Time: 1300-1330 SLP Time Calculation (min) (ACUTE ONLY): 30 min  Problem List:  Patient Active Problem List   Diagnosis Date Noted  . Left arm weakness 09/21/2020  . Dementia (HCC) 09/21/2020  . Stroke (HCC) 09/21/2020  . Diabetes mellitus without complication (HCC)   . Hypertension   . GERD (gastroesophageal reflux disease)   . Depression   . Thrombocytopenia (HCC)   . ICH (intracerebral hemorrhage) (HCC) 09/30/2017  . DIZZINESS 02/23/2010  . DYSPNEA 02/23/2010  . ABNORMAL ELECTROCARDIOGRAM 02/23/2010   Past Medical History:  Past Medical History:  Diagnosis Date  . Diabetes mellitus without complication (HCC)   . GERD (gastroesophageal reflux disease)   . Hypertension   . Thrombocytopenia (HCC)    Past Surgical History: History reviewed. No pertinent surgical history. HPI:      Assessment / Plan / Recommendation Clinical Impression  The Mini-Mental State Examination (MMSE) was administered to assess cognitive status of this pt who has a diagnosis of dementia at baseline. Pt scored 13/30 (n=26+/30). Points were lost on orientation, attention/calculation, delayed recall, following complex directions, writing and design copying. No family was present to discuss pt's level of function prior to admit, however, given history of dementia and MRI results, Speech therapy follow up upon arrival to SNF is recommended to maximize safety and independence and decrease caregiver burden. No further acute ST intervention is recommended at this time. Please reconsult if needs arise.    SLP Assessment  SLP Recommendation/Assessment: All further Speech Language Pathology needs can be addressed in the next venue of care  SLP Visit Diagnosis: Cognitive communication deficit (R41.841)    Follow Up Recommendations  24 hour supervision/assistance;Skilled Nursing  facility       SLP Evaluation Cognition  Overall Cognitive Status: History of cognitive impairments - at baseline Arousal/Alertness: Awake/alert Orientation Level: Oriented to person;Disoriented to place;Disoriented to time;Disoriented to situation Memory: Impaired Memory Impairment: Retrieval deficit;Decreased recall of new information;Decreased short term memory       Comprehension  Auditory Comprehension Overall Auditory Comprehension: Appears within functional limits for tasks assessed    Expression Expression Primary Mode of Expression: Verbal Verbal Expression Overall Verbal Expression: Appears within functional limits for tasks assessed Written Expression Dominant Hand: Right   Oral / Motor  Oral Motor/Sensory Function Overall Oral Motor/Sensory Function: Within functional limits Motor Speech Overall Motor Speech: Appears within functional limits for tasks assessed   GO                   Etan Vasudevan B. Murvin Natal [facility], CCC-SLP Speech Language Pathologist [phone]  Leigh Aurora 09/22/2020, 1:41 PM

## 2020-09-23 ENCOUNTER — Observation Stay: Payer: Medicare Other

## 2020-09-23 DIAGNOSIS — I10 Essential (primary) hypertension: Secondary | ICD-10-CM | POA: Diagnosis not present

## 2020-09-23 LAB — COMPREHENSIVE METABOLIC PANEL
ALT: 19 U/L (ref 0–44)
AST: 31 U/L (ref 15–41)
Albumin: 3.4 g/dL — ABNORMAL LOW (ref 3.5–5.0)
Alkaline Phosphatase: 93 U/L (ref 38–126)
Anion gap: 11 (ref 5–15)
BUN: 19 mg/dL (ref 8–23)
CO2: 25 mmol/L (ref 22–32)
Calcium: 9 mg/dL (ref 8.9–10.3)
Chloride: 106 mmol/L (ref 98–111)
Creatinine, Ser: 1.04 mg/dL (ref 0.61–1.24)
GFR, Estimated: >60 mL/min (ref >60)
Glucose, Bld: 118 mg/dL — ABNORMAL HIGH (ref 70–99)
Potassium: 4.1 mmol/L (ref 3.5–5.1)
Sodium: 142 mmol/L (ref 135–145)
Total Bilirubin: 1.5 mg/dL — ABNORMAL HIGH (ref 0.3–1.2)
Total Protein: 6.3 g/dL — ABNORMAL LOW (ref 6.5–8.1)

## 2020-09-23 LAB — CBC
HCT: 38.1 % — ABNORMAL LOW (ref 39.0–52.0)
Hemoglobin: 12.7 g/dL — ABNORMAL LOW (ref 13.0–17.0)
MCH: 30.7 pg (ref 26.0–34.0)
MCHC: 33.3 g/dL (ref 30.0–36.0)
MCV: 92 fL (ref 80.0–100.0)
Platelets: 92 10*3/uL — ABNORMAL LOW (ref 150–400)
RBC: 4.14 MIL/uL — ABNORMAL LOW (ref 4.22–5.81)
RDW: 14.8 % (ref 11.5–15.5)
WBC: 8.1 10*3/uL (ref 4.0–10.5)
nRBC: 0 % (ref 0.0–0.2)

## 2020-09-23 LAB — GLUCOSE, CAPILLARY
Glucose-Capillary: 117 mg/dL — ABNORMAL HIGH (ref 70–99)
Glucose-Capillary: 162 mg/dL — ABNORMAL HIGH (ref 70–99)

## 2020-09-23 MED ORDER — CLOPIDOGREL BISULFATE 75 MG PO TABS: 75.0000 mg | ORAL_TABLET | Freq: Every day | ORAL | 0 refills | Status: AC

## 2020-09-23 MED ORDER — ATORVASTATIN CALCIUM 40 MG PO TABS: 40.0000 mg | ORAL_TABLET | Freq: Every day | ORAL | 0 refills | Status: AC

## 2020-09-23 NOTE — TOC Transition Note (Signed)
Transition of Care [facility]) - CM/SW Discharge Note   Patient Details  Name: Paul Martinez MRN: [id number] Date of Birth: 09/25/1929  Transition of Care [facility]) CM/SW Contact:  Allayne Butcher, RN Phone Number: 09/23/2020, 12:08 PM   Clinical Narrative:    Patient is medically cleared for discharge back to [facility] in [place].  Patient will get therapy at the facility, PT, OT and Speech.   [facility] will provide transportation for the patient to return to the facility.  Patient's son at the bedside and updated on discharge plan.     Final next level of care: Assisted Living Barriers to Discharge: No Barriers Identified   Patient Goals and CMS Choice Patient states their goals for this hospitalization and ongoing recovery are:: Ready to go back to the [facility] and get PT and OT there CMS Medicare.gov Compare Post Acute Care list provided to:: Patient Represenative (must comment) Choice offered to / list presented to : Adult Children  Discharge Placement              Patient chooses bed at:  ([facility])        Discharge Plan and Services   Discharge Planning Services: CM Consult            DME Arranged: N/A         HH Arranged: PT,OT,Speech Therapy       Representative spoke with at [facility] Agency: [facility] will provide in house therapy  Social Determinants of Health (SDOH) Interventions     Readmission Risk Interventions No flowsheet data found.

## 2020-09-23 NOTE — Progress Notes (Signed)
09/22/20 2200 09/22/20 2207  What Happened  Was fall witnessed? No  --   Was patient injured? Yes  --   Patient found on floor  --   Found by Staff-comment  --   Stated prior activity bathroom-unassisted  --   Follow Up  MD notified Manuela Schwartz NP  --   Time MD notified 2300  --   Family notified No - patient refusal (attempted to call son, see progress note)  --   Time family notified 2330  --   Additional tests Yes-comment (CT of head)  --   Simple treatment Dressing  --   Progress note created (see row info) Yes  --   Adult Fall Risk Assessment  Risk Factor Category (scoring not indicated) Fall has occurred during this admission (document High fall risk)  --   Patient Fall Risk Level High fall risk  --   Adult Fall Risk Interventions  Required Bundle Interventions *See Row Information* High fall risk - low, moderate, and high requirements implemented  --   Additional Interventions Room near nurses station;Use of appropriate toileting equipment (bedpan, BSC, etc.)  --   Screening for Fall Injury Risk (To be completed on HIGH fall risk patients) - Assessing Need for Low Bed  Risk For Fall Injury- Low Bed Criteria Previous fall this admission  --   Will Implement Low Bed and Floor Mats Yes  --   Vitals  Temp  --  99.3 F (37.4 C)  Temp Source  --  Oral  BP  --  (!) 130/51  MAP (mmHg)  --  82  BP Location  --  Right Arm  BP Method  --  Automatic  Patient Position (if appropriate)  --  Lying  ECG Heart Rate  --  73  Resp  --  15  Oxygen Therapy  SpO2  --  96 %  O2 Device  --  Room Air  Pain Assessment  Pain Scale  --  0-10  Pain Score  --  0  Neurological  Neuro (WDL)  --  X  Level of Consciousness  --  Alert  Orientation Level  --  Oriented to person;Oriented to place  Cognition  --  Poor attention/concentration;Poor judgement;Poor safety awareness  Speech  --  Clear  Pupil Assessment   --  Yes  R Pupil Size (mm)  --  3  R Pupil Shape  --  Round  R Pupil  Reaction  --  Brisk  L Pupil Size (mm)  --  3  L Pupil Shape  --  Round  L Pupil Reaction  --  Brisk  Facial Symmetry  --  Symmetrical  R Hand Grip  --  Strong;Present  L Hand Grip  --  Weak ;Present  R Foot Dorsiflexion  --  Present;Moderate  L Foot Dorsiflexion  --  Present;Moderate  R Foot Plantar Flexion  --  Present;Moderate  L Foot Plantar Flexion  --  Present;Moderate  NIH Stroke Scale ( + Modified Stroke Scale Criteria)   Interval  --  Other (Comment) (post fall)  Level of Consciousness (1a.)     --  0  LOC Questions (1b. )   +  --  0  LOC Commands (1c. )   +   --  0  Best Gaze (2. )  +  --  0  Visual (3. )  +  --  0  Facial Palsy (4. )      --  0  Motor Arm, Left (5a. )   +  --  1  Motor Arm, Right (5b. )   +  --  0  Motor Leg, Left (6a. )   +  --  1  Motor Leg, Right (6b. )   +  --  0  Limb Ataxia (7. )  --  2  Sensory (8. )   +  --  0  Best Language (9. )   +  --  0  Dysarthria (10. )  --  0  Extinction/Inattention (11.)   +  --  0  Modified SS Total  +  --  2  Complete NIHSS TOTAL  --  4  Musculoskeletal  Musculoskeletal (WDL)  --  X  Assistive Device  --  None  Generalized Weakness  --  Yes  Weight Bearing Restrictions  --  No  Integumentary  Integumentary (WDL)  --  X  Skin Condition  --  Dry  Skin Integrity  --  Other (Comment) (skin tear)

## 2020-09-23 NOTE — NC FL2 (Signed)
[place] MEDICAID FL2 LEVEL OF CARE SCREENING TOOL     IDENTIFICATION  Patient Name: Paul Martinez Birthdate: May 22, 1930 Sex: male Admission Date (Current Location): 09/21/2020  [place] and [place] Number:  Chiropodist and Address:  [facility], [address] [place], [place] [address]      Provider Number: [id number]  Attending Physician Name and Address:  Azucena Fallen, MD  Relative Name and Phone Number:  Kimoni Pagliarulo (son) [phone]    Current Level of Care: Hospital Recommended Level of Care: Assisted Living Facility Prior Approval Number:    Date Approved/Denied:   PASRR Number:    Discharge Plan: Other (Comment) (Assisted Living)    Current Diagnoses: Patient Active Problem List   Diagnosis Date Noted  . Left arm weakness 09/21/2020  . Dementia (HCC) 09/21/2020  . Stroke (HCC) 09/21/2020  . Diabetes mellitus without complication (HCC)   . Hypertension   . GERD (gastroesophageal reflux disease)   . Depression   . Thrombocytopenia (HCC)   . ICH (intracerebral hemorrhage) (HCC) 09/30/2017  . DIZZINESS 02/23/2010  . DYSPNEA 02/23/2010  . ABNORMAL ELECTROCARDIOGRAM 02/23/2010    Orientation RESPIRATION BLADDER Height & Weight     Self  Normal Incontinent Weight: 98 kg Height:  5\' 11"  (180.3 cm)  BEHAVIORAL SYMPTOMS/MOOD NEUROLOGICAL BOWEL NUTRITION STATUS      Incontinent  (Dysphagia diet 2)  AMBULATORY STATUS COMMUNICATION OF NEEDS Skin   Limited Assist   Normal                       Personal Care Assistance Level of Assistance  Bathing,Feeding,Dressing Bathing Assistance: Limited assistance Feeding assistance: Limited assistance Dressing Assistance: Limited assistance     Functional Limitations Info  Hearing   Hearing Info: Impaired      SPECIAL CARE FACTORS FREQUENCY  Speech therapy     PT Frequency: Evaluate and treat at [facility] OT Frequency: Evaluate and treat at [facility]      Speech Therapy Frequency: eval and treat      Contractures Contractures Info: Not present    Additional Factors Info  Code Status,Allergies Code Status Info: DNR Allergies Info: PCN           Current Medications (09/23/2020):  This is the current hospital active medication list Current Facility-Administered Medications  Medication Dose Route Frequency Provider Last Rate Last Admin  . acetaminophen (TYLENOL) tablet 650 mg  650 mg Oral Q4H PRN 11/21/2020, MD   650 mg at 09/22/20 2303   Or  . acetaminophen (TYLENOL) 160 MG/5ML solution 650 mg  650 mg Per Tube Q4H PRN 2304, MD       Or  . acetaminophen (TYLENOL) suppository 650 mg  650 mg Rectal Q4H PRN Lorretta Harp, MD      . aspirin suppository 300 mg  300 mg Rectal Daily Lorretta Harp, MD       Or  . aspirin tablet 325 mg  325 mg Oral Daily Lorretta Harp, MD   325 mg at 09/22/20 1011  . atorvastatin (LIPITOR) tablet 40 mg  40 mg Oral Daily 11/20/20, MD   40 mg at 09/22/20 1012  . clopidogrel (PLAVIX) tablet 75 mg  75 mg Oral Daily Bhagat, Srishti L, MD      . dextromethorphan-guaiFENesin (MUCINEX DM) 30-600 MG per 12 hr tablet 1 tablet  1 tablet Oral Q12H PRN 11/20/20, MD      . donepezil (ARICEPT) tablet 10  mg  10 mg Oral Marice Potter, MD   10 mg at 09/22/20 2303  . enoxaparin (LOVENOX) injection 50 mg  0.5 mg/kg Subcutaneous Q24H Albina Billet, RPH   50 mg at 09/22/20 2226  . hydrALAZINE (APRESOLINE) injection 5 mg  5 mg Intravenous Q2H PRN Lorretta Harp, MD      . insulin aspart (novoLOG) injection 0-5 Units  0-5 Units Subcutaneous QHS Lorretta Harp, MD      . insulin aspart (novoLOG) injection 0-9 Units  0-9 Units Subcutaneous TID WC Lorretta Harp, MD   2 Units at 09/22/20 1334  . iron polysaccharides (NIFEREX) capsule 150 mg  150 mg Oral Daily Lorretta Harp, MD   150 mg at 09/22/20 1011  . multivitamin with minerals tablet 1 tablet  1 tablet Oral Daily Lorretta Harp, MD   1 tablet at 09/22/20 1012  . ondansetron (ZOFRAN)  injection 4 mg  4 mg Intravenous Q8H PRN Lorretta Harp, MD   4 mg at 09/22/20 2223  . pantoprazole (PROTONIX) EC tablet 20 mg  20 mg Oral Daily Lorretta Harp, MD   20 mg at 09/22/20 1012  . senna-docusate (Senokot-S) tablet 1 tablet  1 tablet Oral QHS PRN Lorretta Harp, MD      . sertraline (ZOLOFT) tablet 100 mg  100 mg Oral Daily Lorretta Harp, MD   100 mg at 09/22/20 1010  . vitamin B-12 (CYANOCOBALAMIN) tablet 1,000 mcg  1,000 mcg Oral Daily Lorretta Harp, MD   1,000 mcg at 09/22/20 1011     Discharge Medications: Medication List    STOP taking these medications   HYDROcodone-acetaminophen 5-325 MG tablet Commonly known as: NORCO/VICODIN   pseudoephedrine-guaifenesin 60-600 MG 12 hr tablet Commonly known as: MUCINEX D     TAKE these medications   acetaminophen 325 MG tablet Commonly known as: TYLENOL Take 650 mg by mouth every 6 (six) hours as needed.   aspirin EC 81 MG tablet Take 81 mg by mouth daily. Swallow whole.   atorvastatin 40 MG tablet Commonly known as: LIPITOR Take 1 tablet (40 mg total) by mouth daily.   buPROPion 150 MG 24 hr tablet Commonly known as: WELLBUTRIN XL Take 150 mg by mouth every morning.   clopidogrel 75 MG tablet Commonly known as: PLAVIX Take 1 tablet (75 mg total) by mouth daily.   donepezil 10 MG tablet Commonly known as: ARICEPT Take 10 mg by mouth at bedtime.   glipiZIDE 10 MG tablet Commonly known as: GLUCOTROL Take 10 mg by mouth daily before breakfast.   iron polysaccharides 150 MG capsule Commonly known as: NIFEREX Take 150 mg by mouth daily.   lisinopril 40 MG tablet Commonly known as: ZESTRIL Take 40 mg by mouth daily.   metFORMIN 500 MG tablet Commonly known as: GLUCOPHAGE Take 500 mg by mouth 2 (two) times daily with a meal.   pantoprazole 20 MG tablet Commonly known as: PROTONIX Take 20 mg by mouth daily.   PRESERVISION AREDS 2+MULTI VIT PO Take 1 tablet by mouth 2 (two) times daily.   sertraline 50 MG  tablet Commonly known as: ZOLOFT Take 100 mg by mouth daily.   vitamin B-12 1000 MCG tablet Commonly known as: CYANOCOBALAMIN Take 1,000 mcg by mouth daily.     Relevant Imaging Results:  Relevant Lab Results:   Additional Information SS# [id number]  Allayne Butcher, RN

## 2020-09-23 NOTE — Progress Notes (Signed)
Physical Therapy Treatment Patient Details Name: Paul Martinez MRN: [id number] DOB: [birth year] Today's Date: 09/23/2020    History of Present Illness Pt is an 87 y.o. male with medical history significant of dementia, hypertension, diabetes mellitus, GERD, depression, ICH, thrombocytopenia, who presents with left arm weakness.  MD assessment includes: Left arm weakness due to possible stroke with head MRI impression "2 cm acute ischemic nonhemorrhagic right MCA distribution infarct involving the posterior right frontal lobe".    PT Comments    Pt was pleasant and motivated to participate during the session and made very good progress towards goals.  Pt did not require any assistance with bed mobility tasks and upon coming to sitting presented with good sitting balance without the posterior lean observed yesterday.  Pt was able to transfer with good control and stability and similarly to sitting once in standing presented without posterior lean. Pt was steady with ambulation and was able to maintain his grip on the walker handle with his left hand throughout the session without assistance. Pt will benefit from HHPT services upon discharge to safely address deficits listed in patient problem list for decreased caregiver assistance and eventual return to PLOF.      Follow Up Recommendations  Home health PT;Supervision/Assistance - 24 hour     Equipment Recommendations  Rolling walker with 5" wheels;Other (comment) (Pt unsure if he has access to a RW at his ALF)    Recommendations for Other Services       Precautions / Restrictions Precautions Precautions: Fall Restrictions Weight Bearing Restrictions: No    Mobility  Bed Mobility Overal bed mobility: Needs Assistance Bed Mobility: Supine to Sit     Supine to sit: Supervision     General bed mobility comments: Extra time and effort only    Transfers Overall transfer level: Needs assistance Equipment used: Rolling walker (2  wheeled) Transfers: Sit to/from Stand Sit to Stand: Min guard         General transfer comment: Good eccentric and concentric control  Ambulation/Gait Ambulation/Gait assistance: Min guard Gait Distance (Feet): 20 Feet x 1, 5 Feet x 2 Assistive device: Rolling walker (2 wheeled) Gait Pattern/deviations: Decreased step length - right;Decreased step length - left;Step-through pattern Gait velocity: decreased   General Gait Details: Slow cadence but steady without LOB including forwards and backwards walking with a RW   Stairs             Wheelchair Mobility    Modified Rankin (Stroke Patients Only)       Balance Overall balance assessment: Needs assistance Sitting-balance support: Feet supported;Bilateral upper extremity supported Sitting balance-Leahy Scale: Good Sitting balance - Comments: No posterior instability this session   Standing balance support: Bilateral upper extremity supported;During functional activity Standing balance-Leahy Scale: Good Standing balance comment: Pt steady with min lean on the RW for support                            Cognition Arousal/Alertness: Awake/alert Behavior During Therapy: WFL for tasks assessed/performed Overall Cognitive Status: History of cognitive impairments - at baseline                                        Exercises Total Joint Exercises Ankle Circles/Pumps: AROM;Both;10 reps;Strengthening;5 reps Quad Sets: Strengthening;Both;10 reps;5 reps Towel Squeeze: Strengthening;Both;5 reps;10 reps Short Arc Quad: Strengthening;Both;10 reps Hip ABduction/ADduction:  AROM;Strengthening;Both;5 reps;10 reps Straight Leg Raises: AROM;Strengthening;Both;5 reps;10 reps Long Arc Quad: Strengthening;Both;10 reps;5 reps Knee Flexion: Strengthening;Both;10 reps;5 reps Other Exercises Other Exercises: Static unsupported sitting at EOB for core therex and increased activity tolerance Other Exercises:  Static standing balance training with BUE and single UE support for increased activity tolerance    General Comments        Pertinent Vitals/Pain Pain Assessment: No/denies pain    Home Living                      Prior Function            PT Goals (current goals can now be found in the care plan section) Progress towards PT goals: Progressing toward goals    Frequency    7X/week      PT Plan Discharge plan needs to be updated    Co-evaluation              AM-PAC PT "6 Clicks" Mobility   Outcome Measure  Help needed turning from your back to your side while in a flat bed without using bedrails?: None Help needed moving from lying on your back to sitting on the side of a flat bed without using bedrails?: None Help needed moving to and from a bed to a chair (including a wheelchair)?: A Little Help needed standing up from a chair using your arms (e.g., wheelchair or bedside chair)?: A Little Help needed to walk in hospital room?: A Little Help needed climbing 3-5 steps with a railing? : A Little 6 Click Score: 20    End of Session Equipment Utilized During Treatment: Gait belt Activity Tolerance: Patient tolerated treatment well Patient left: in chair;with call bell/phone within reach;with chair alarm set;with nursing/sitter in room Nurse Communication: Mobility status PT Visit Diagnosis: Unsteadiness on feet (R26.81);Difficulty in walking, not elsewhere classified (R26.2);Muscle weakness (generalized) (M62.81);Hemiplegia and hemiparesis Hemiplegia - Right/Left: Left Hemiplegia - dominant/non-dominant: Non-dominant Hemiplegia - caused by: Cerebral infarction     Time: 6063-0160 PT Time Calculation (min) (ACUTE ONLY): 39 min  Charges:  $Gait Training: 8-22 mins $Therapeutic Exercise: 8-22 mins $Therapeutic Activity: 8-22 mins                     D. Scott Shanigua Gibb PT, DPT 09/23/20, 10:09 AM

## 2020-09-23 NOTE — Care Management Obs Status (Signed)
MEDICARE OBSERVATION STATUS NOTIFICATION   Patient Details  Name: Paul Martinez MRN: [id number] Date of Birth: [birth year]   Medicare Observation Status Notification Given:  Yes    Allayne Butcher, RN 09/23/2020, 10:05 AM

## 2020-09-23 NOTE — Evaluation (Signed)
Clinical/Bedside Swallow Evaluation Patient Details  Name: Paul Martinez MRN: [id number] Date of Birth: Jun 20, 1930  Today's Date: 09/23/2020 Time: SLP Start Time (ACUTE ONLY): 1000 SLP Stop Time (ACUTE ONLY): 1030 SLP Time Calculation (min) (ACUTE ONLY): 30 min  Past Medical History:  Past Medical History:  Diagnosis Date  . Diabetes mellitus without complication (HCC)   . GERD (gastroesophageal reflux disease)   . Hypertension   . Thrombocytopenia (HCC)    Past Surgical History: History reviewed. No pertinent surgical history. HPI:  87yo male admitted 09/21/20 left arm weakness. PMH: dementia, HTN, DM, GERD, depression, ICH, thrombocytopenia, hearing loss. MRI = acute ischemic nonhemorrhagic R MCA distribution infarct involving the posterior right frontal lobe. Moderately advanced cerebral atrophy with chronic microvascular ischemic disease with multiple remote bilateral PCA and cerebellar infarcts.   Assessment / Plan / Recommendation Clinical Impression  Orders received for swallow evaluation due to choking episode at dinner last night. Pt was seated in recliner, awake and conversant. CN exam unremarkable. Pt was observed with thin liquids, puree, and solid textures. No overt s/s aspiration observed on any texture. Suspect primarily cognitively based issues, with positioning, attention to task, and working memory adversely affecting safety with large bites of solid food. Pt benefits from having solids cut into bite size pieces, and intermittent supervision for safety. Minimizing distractions is also beneficial given cognitive impairment. Will downgrade diet to dys 2/thin liquids (finely chopped solids) to maximize safety. Son reports intermittent poor po intake. SLP suggested supplements including ensure/boost/magic cup. Recommend follow up at [facility] for education.   SLP Visit Diagnosis: Dysphagia, unspecified (R13.10)    Aspiration Risk  Mild aspiration risk    Diet Recommendation  Dysphagia 2 (Fine chop);Thin liquid   Liquid Administration via: Straw;Cup Medication Administration:  (as tolerated) Supervision: Patient able to self feed;Staff to assist with self feeding;Intermittent supervision to cue for compensatory strategies Compensations: Minimize environmental distractions;Slow rate;Small sips/bites Postural Changes: Seated upright at 90 degrees    Other  Recommendations Oral Care Recommendations: Oral care BID   Follow up Recommendations 24 hour supervision/assistance;Skilled Nursing facility          Prognosis Prognosis for Safe Diet Advancement: Fair Barriers to Reach Goals: Cognitive deficits      Swallow Study   General Date of Onset: 09/21/20 HPI: 87yo male admitted 09/21/20 left arm weakness. PMH: dementia, HTN, DM, GERD, depression, ICH, thrombocytopenia, hearing loss. MRI = acute ischemic nonhemorrhagic R MCA distribution infarct involving the posterior right frontal lobe. Moderately advanced cerebral atrophy with chronic microvascular ischemic disease with multiple remote bilateral PCA and cerebellar infarcts. Type of Study: Bedside Swallow Evaluation Diet Prior to this Study: Regular;Thin liquids Temperature Spikes Noted: No Respiratory Status: Room air History of Recent Intubation: No Behavior/Cognition: Alert;Cooperative;Pleasant mood;Confused Oral Cavity Assessment: Within Functional Limits Oral Care Completed by SLP: No Vision: Functional for self-feeding Self-Feeding Abilities: Able to feed self;Needs assist;Needs set up Patient Positioning: Upright in chair Baseline Vocal Quality: Normal Volitional Cough: Strong Volitional Swallow: Able to elicit    Oral/Motor/Sensory Function Overall Oral Motor/Sensory Function: Within functional limits   Ice Chips Ice chips: Not tested   Thin Liquid Thin Liquid: Within functional limits Presentation: Straw;Cup    Nectar Thick Nectar Thick Liquid: Not tested   Honey Thick Honey Thick Liquid: Not  tested   Puree Puree: Within functional limits Presentation: Self Fed;Spoon   Solid     Solid: Within functional limits Presentation: Self Fed Other Comments: pt benefits from having solids cut into small  pieces due to cognitive impairment      Amberley Hamler B. Murvin Natal [facility], CCC-SLP Speech Language Pathologist [phone]  Leigh Aurora 09/23/2020,10:44 AM

## 2020-09-23 NOTE — Discharge Summary (Signed)
Physician Discharge Summary  Paul Martinez:[id number] DOB: [birth year] DOA: 09/21/2020  PCP: Barbette Reichmann, MD  Admit date: 09/21/2020 Discharge date: 09/23/2020  Admitted From: SNF Disposition: SNF  Recommendations for Outpatient Follow-up:  1. Follow up with PCP in 1-2 weeks 2. Please obtain BMP/CBC in one week 3. Please follow-up with speech therapy and physical therapy at nursing facility for ongoing therapy evaluation  Speech therapy, physical therapy Equipment/Devices: Rolling walker  Discharge Condition: Stable CODE STATUS: DNR Diet recommendation: Dysphagia 2 diet, liberalized to increase p.o. intake  Brief/Interim Summary: Paul Martinez a 87 y.o.malewith medical history significant ofdementia, hypertension, diabetes mellitus, GERD, depression, ICH, thrombocytopenia, who presents with left arm weakness. Per his son, patient developed left arm weakness when he was doing physical therapy in[facility]F this AM, at about 10:00 to 10:30.In ED - CT head is negative for acute intracranial abnormalities.  MRI shows acute ischemic nonhemorrhagic right MCA, neurology consulted, PT following.  Hospitalist called for admission.  Patient admitted as above with acute left-sided weakness in the setting of right acute MCA infarct.  Patient's deficits continue to improve drastically over the past 48 hours, patient did have notable dysphagia/difficulty swallowing with an episode of choking over the past 24 hours, speech was called to reevaluate, patient's swallowing evaluation went quite well it appears his issue is more mentation and cognitive-based as he gets distracted and does not always realize he still has food left in his mouth.  With dysphagia 2 diet per speech recommendations patient has had no further issues or episodes of choking.  Patient otherwise stable and agreeable for discharge back to facility, discussed with son at bedside.  Patient will be on 81 mg aspirin with 21 days of  Plavix and continue on high-dose statin.  Continue speech therapy, physical therapy at SNF per recommendations here.  Discharge Diagnoses:  Principal Problem:   Left arm weakness Active Problems:   Diabetes mellitus without complication (HCC)   Hypertension   GERD (gastroesophageal reflux disease)   Depression   Thrombocytopenia (HCC)   Dementia (HCC)   Stroke [facility])    Discharge Instructions  Discharge Instructions    Call MD for:  persistant dizziness or light-headedness   Complete by: As directed    Diet general   Complete by: As directed    Dysphagia 2   Increase activity slowly   Complete by: As directed      Allergies as of 09/23/2020      Reactions   Penicillins    nka is listed on MAR      Medication List    STOP taking these medications   HYDROcodone-acetaminophen 5-325 MG tablet Commonly known as: NORCO/VICODIN   pseudoephedrine-guaifenesin 60-600 MG 12 hr tablet Commonly known as: MUCINEX D     TAKE these medications   acetaminophen 325 MG tablet Commonly known as: TYLENOL Take 650 mg by mouth every 6 (six) hours as needed.   aspirin EC 81 MG tablet Take 81 mg by mouth daily. Swallow whole.   atorvastatin 40 MG tablet Commonly known as: LIPITOR Take 1 tablet (40 mg total) by mouth daily.   buPROPion 150 MG 24 hr tablet Commonly known as: WELLBUTRIN XL Take 150 mg by mouth every morning.   clopidogrel 75 MG tablet Commonly known as: PLAVIX Take 1 tablet (75 mg total) by mouth daily.   donepezil 10 MG tablet Commonly known as: ARICEPT Take 10 mg by mouth at bedtime.   glipiZIDE 10 MG tablet Commonly known as: GLUCOTROL  Take 10 mg by mouth daily before breakfast.   iron polysaccharides 150 MG capsule Commonly known as: NIFEREX Take 150 mg by mouth daily.   lisinopril 40 MG tablet Commonly known as: ZESTRIL Take 40 mg by mouth daily.   metFORMIN 500 MG tablet Commonly known as: GLUCOPHAGE Take 500 mg by mouth 2 (two) times daily  with a meal.   pantoprazole 20 MG tablet Commonly known as: PROTONIX Take 20 mg by mouth daily.   PRESERVISION AREDS 2+MULTI VIT PO Take 1 tablet by mouth 2 (two) times daily.   sertraline 50 MG tablet Commonly known as: ZOLOFT Take 100 mg by mouth daily.   vitamin B-12 1000 MCG tablet Commonly known as: CYANOCOBALAMIN Take 1,000 mcg by mouth daily.       Allergies  Allergen Reactions  . Penicillins     nka is listed on MAR    Consultations:  Neurology   Procedures/Studies: CT HEAD WO CONTRAST  Result Date: 09/23/2020 CLINICAL DATA:  Confusion.  Unwitnessed fall. EXAM: CT HEAD WITHOUT CONTRAST TECHNIQUE: Contiguous axial images were obtained from the base of the skull through the vertex without intravenous contrast. COMPARISON:  09/21/2020 FINDINGS: Brain: Old cerebellar infarctions as seen previously. Old left occipital infarction. Old left frontal infarction. Extensive chronic small-vessel ischemic changes affecting the pons, thalami, basal ganglia and hemispheric white matter. No sign of recent infarction. No mass, hemorrhage, hydrocephalus or extra-axial collection Vascular: There is atherosclerotic calcification of the major vessels at the base of the brain. Skull: Negative Sinuses/Orbits: Clear/normal Other: None IMPRESSION: No acute finding by CT. Extensive chronic ischemic changes throughout the brain as outlined above. Electronically Signed   By: Paulina Fusi M.D.   On: 09/23/2020 01:26   CT Head Wo Contrast  Result Date: 09/21/2020 CLINICAL DATA:  Fall left arm weakness. EXAM: CT HEAD WITHOUT CONTRAST TECHNIQUE: Contiguous axial images were obtained from the base of the skull through the vertex without intravenous contrast. COMPARISON:  CT head dated September 30, 2017. FINDINGS: Brain: No evidence of acute infarction, hemorrhage, hydrocephalus, extra-axial collection or mass lesion/mass effect. 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  Stable atrophy. Progressive microvascular ischemic changes. Vascular: Calcified atherosclerosis at the skullbase. No hyperdense vessel. Skull: Normal. Negative for fracture or focal lesion. Sinuses/Orbits: No acute finding. Other: None. IMPRESSION: 1. No acute intracranial abnormality. 2. Progressive chronic microvascular ischemic changes. Electronically Signed   By: Obie Dredge M.D.   On: 09/21/2020 12:36   MR ANGIO HEAD WO CONTRAST  Result Date: 09/21/2020 CLINICAL DATA:  Initial evaluation for acute stroke. History of dementia. EXAM: MRI HEAD WITHOUT CONTRAST MRA HEAD WITHOUT CONTRAST TECHNIQUE: Multiplanar, multiecho pulse sequences of the brain and surrounding structures were obtained without intravenous contrast. Angiographic images of the head were obtained using MRA technique without contrast. COMPARISON:  Prior head CT from earlier the same day. FINDINGS: MRI HEAD FINDINGS Brain: Examination somewhat limited as the patient was unable to tolerate the full length of the exam. Coronal T2 and SWI sequences were not able to be obtained. Diffuse prominence of the CSF containing spaces compatible with generalized cerebral atrophy, moderately advanced in nature. Patchy and confluent T2/FLAIR hyperintensity involving the periventricular deep white matter both cerebral hemispheres most consistent with chronic small vessel ischemic disease, fairly advanced in nature. Few scatter remote lacunar infarcts noted about the deep gray nuclei and hemispheric cerebral white matter. Patchy involvement of the midbrain noted. Small remote bilateral PCA territory infarcts involving the occipital  lobes noted, left slightly larger than right. Remote bilateral cerebellar infarcts noted as well, also greater on the left. 2 cm linear focus of restricted diffusion involving the cortical and subcortical aspect of the posterior right frontal lobe, consistent with an acute right MCA distribution infarct (series 7, image 17).  Involvement of the precentral gyrus. No associated mass effect. No definite associated hemorrhage, although evaluation somewhat limited by lack of gradient echo sequence. No visible hemorrhage on prior CT. No other evidence for acute or subacute ischemia. Gray-white matter differentiation otherwise maintained. No mass lesion, midline shift or mass effect. Diffuse ventricular prominence related to global parenchymal volume loss without hydrocephalus. No extra-axial fluid collection. Pituitary gland suprasellar region within normal limits. Midline structures intact. Vascular: Abnormal flow void within the right vertebral artery, which could be related to slow flow and/or occlusion. Major intracranial vascular flow voids are otherwise maintained. Skull and upper cervical spine: Craniocervical junction within normal limits. Bone marrow signal intensity normal. No scalp soft tissue abnormality. Sinuses/Orbits: Patient status post bilateral ocular lens replacement. Globes and orbital soft tissues demonstrate no acute finding. Paranasal sinuses are largely clear. Small left greater than right mastoid effusions, of doubtful significance. Inner ear structures grossly normal. Visualized nasopharynx within normal limits. Other: None. MRA HEAD FINDINGS ANTERIOR CIRCULATION: Visualized distal cervical segments of the internal carotid arteries are patent with antegrade flow. Petrous, cavernous, and supraclinoid segments widely patent bilaterally. A1 segments patent. Normal anterior communicating artery complex. Anterior cerebral arteries patent to their distal aspects without stenosis. Short-segment mild atheromatous stenosis at the mid left M1 segment. Right M1 widely patent. Normal MCA bifurcations. Distal MCA branches well perfused and fairly symmetric. POSTERIOR CIRCULATION: Left vertebral artery widely patent to the vertebrobasilar junction. Left PICA origin patent and normal. Right vertebral artery largely not visualized,  likely occluded. Scant retrograde flow into the distal right V4 segment. Basilar widely patent to its distal aspect. Superior cerebellar arteries patent bilaterally. Both PCAs primarily supplied via the basilar. Short-segment moderate proximal right P2 stenosis (series 1059, image 11). Additional short-segment moderate distal left P2 stenosis (series 1051, image 6). PCAs otherwise patent to their distal aspects. No intracranial aneurysm. IMPRESSION: MRI HEAD IMPRESSION: 1. Technically limited exam due to the patient's inability to tolerate the full length of the study. 2. 2 cm acute ischemic nonhemorrhagic right MCA distribution infarct involving the posterior right frontal lobe. No associated mass effect. 3. Underlying moderately advanced cerebral atrophy with chronic microvascular ischemic disease, with multiple remote bilateral PCA and cerebellar infarcts as above. MRA HEAD IMPRESSION: 1. Chronic occlusion of the right vertebral artery. Dominant left vertebral artery widely patent. 2. Short-segment moderate bilateral P2 stenoses as above. 3. Otherwise negative intracranial MRA, with no large vessel occlusion or hemodynamically significant stenosis. Electronically Signed   By: Rise Mu M.D.   On: 09/21/2020 17:12   MR BRAIN WO CONTRAST  Result Date: 09/21/2020 CLINICAL DATA:  Initial evaluation for acute stroke. History of dementia. EXAM: MRI HEAD WITHOUT CONTRAST MRA HEAD WITHOUT CONTRAST TECHNIQUE: Multiplanar, multiecho pulse sequences of the brain and surrounding structures were obtained without intravenous contrast. Angiographic images of the head were obtained using MRA technique without contrast. COMPARISON:  Prior head CT from earlier the same day. FINDINGS: MRI HEAD FINDINGS Brain: Examination somewhat limited as the patient was unable to tolerate the full length of the exam. Coronal T2 and SWI sequences were not able to be obtained. Diffuse prominence of the CSF containing spaces  compatible with generalized cerebral atrophy, moderately  advanced in nature. Patchy and confluent T2/FLAIR hyperintensity involving the periventricular deep white matter both cerebral hemispheres most consistent with chronic small vessel ischemic disease, fairly advanced in nature. Few scatter remote lacunar infarcts noted about the deep gray nuclei and hemispheric cerebral white matter. Patchy involvement of the midbrain noted. Small remote bilateral PCA territory infarcts involving the occipital lobes noted, left slightly larger than right. Remote bilateral cerebellar infarcts noted as well, also greater on the left. 2 cm linear focus of restricted diffusion involving the cortical and subcortical aspect of the posterior right frontal lobe, consistent with an acute right MCA distribution infarct (series 7, image 17). Involvement of the precentral gyrus. No associated mass effect. No definite associated hemorrhage, although evaluation somewhat limited by lack of gradient echo sequence. No visible hemorrhage on prior CT. No other evidence for acute or subacute ischemia. Gray-white matter differentiation otherwise maintained. No mass lesion, midline shift or mass effect. Diffuse ventricular prominence related to global parenchymal volume loss without hydrocephalus. No extra-axial fluid collection. Pituitary gland suprasellar region within normal limits. Midline structures intact. Vascular: Abnormal flow void within the right vertebral artery, which could be related to slow flow and/or occlusion. Major intracranial vascular flow voids are otherwise maintained. Skull and upper cervical spine: Craniocervical junction within normal limits. Bone marrow signal intensity normal. No scalp soft tissue abnormality. Sinuses/Orbits: Patient status post bilateral ocular lens replacement. Globes and orbital soft tissues demonstrate no acute finding. Paranasal sinuses are largely clear. Small left greater than right mastoid  effusions, of doubtful significance. Inner ear structures grossly normal. Visualized nasopharynx within normal limits. Other: None. MRA HEAD FINDINGS ANTERIOR CIRCULATION: Visualized distal cervical segments of the internal carotid arteries are patent with antegrade flow. Petrous, cavernous, and supraclinoid segments widely patent bilaterally. A1 segments patent. Normal anterior communicating artery complex. Anterior cerebral arteries patent to their distal aspects without stenosis. Short-segment mild atheromatous stenosis at the mid left M1 segment. Right M1 widely patent. Normal MCA bifurcations. Distal MCA branches well perfused and fairly symmetric. POSTERIOR CIRCULATION: Left vertebral artery widely patent to the vertebrobasilar junction. Left PICA origin patent and normal. Right vertebral artery largely not visualized, likely occluded. Scant retrograde flow into the distal right V4 segment. Basilar widely patent to its distal aspect. Superior cerebellar arteries patent bilaterally. Both PCAs primarily supplied via the basilar. Short-segment moderate proximal right P2 stenosis (series 1059, image 11). Additional short-segment moderate distal left P2 stenosis (series 1051, image 6). PCAs otherwise patent to their distal aspects. No intracranial aneurysm. IMPRESSION: MRI HEAD IMPRESSION: 1. Technically limited exam due to the patient's inability to tolerate the full length of the study. 2. 2 cm acute ischemic nonhemorrhagic right MCA distribution infarct involving the posterior right frontal lobe. No associated mass effect. 3. Underlying moderately advanced cerebral atrophy with chronic microvascular ischemic disease, with multiple remote bilateral PCA and cerebellar infarcts as above. MRA HEAD IMPRESSION: 1. Chronic occlusion of the right vertebral artery. Dominant left vertebral artery widely patent. 2. Short-segment moderate bilateral P2 stenoses as above. 3. Otherwise negative intracranial MRA, with no large  vessel occlusion or hemodynamically significant stenosis. Electronically Signed   By: Rise Mu M.D.   On: 09/21/2020 17:12   US Carotid Bilateral (at [facility] and [facility] only)  Result Date: 09/22/2020 CLINICAL DATA:  Stroke symptoms, hypertension, diabetes EXAM: BILATERAL CAROTID DUPLEX ULTRASOUND TECHNIQUE: Wallace Cullens scale imaging, color Doppler and duplex ultrasound were performed of bilateral carotid and vertebral arteries in the neck. COMPARISON:  09/06/2013 FINDINGS: Criteria: Quantification of carotid  stenosis is based on velocity parameters that correlate the residual internal carotid diameter with NASCET-based stenosis levels, using the diameter of the distal internal carotid lumen as the denominator for stenosis measurement. The following velocity measurements were obtained: RIGHT ICA: 126/14 cm/sec CCA: 102/60 cm/sec SYSTOLIC ICA/CCA RATIO:  1.2 ECA: 170 cm/sec LEFT ICA: 181/12 cm/sec CCA: 115/15 cm/sec SYSTOLIC ICA/CCA RATIO:  1.6 ECA: 125 cm/sec RIGHT CAROTID ARTERY: Mild echogenic shadowing plaque formation. No hemodynamically significant right ICA stenosis, velocity elevation, or turbulent flow. Degree of narrowing less than 50%. RIGHT VERTEBRAL ARTERY:  Normal antegrade flow LEFT CAROTID ARTERY: More severe moderate heterogeneous left carotid bifurcation atherosclerosis. Slight velocity elevation as above. No significant turbulent flow or spectral broadening. Left ICA stenosis still estimated at 50-69% by ultrasound criteria. LEFT VERTEBRAL ARTERY:  Normal antegrade flow IMPRESSION: Right ICA narrowing less than 50% Left ICA stenosis estimated at 50-69% Patent antegrade vertebral flow bilaterally Electronically Signed   By: Judie Petit.  Shick M.D.   On: 09/22/2020 09:48   ECHOCARDIOGRAM COMPLETE  Result Date: 09/22/2020    ECHOCARDIOGRAM REPORT   Patient Name:   POYRAZ SHAUB Date of Exam: 09/21/2020 Medical Rec #:  [id number]       Height:       71.0 in Accession #:    [id number]      Weight:        216.0 lb Date of Birth:  Jan 24, 1930       BSA:          2.179 m Patient Age:    87 years        BP:           144/80 mmHg Patient Gender: M               HR:           64 bpm. Exam Location:  [facility] Procedure: 2D Echo, Cardiac Doppler and Color Doppler Indications:     I163.9 Stroke  History:         Patient has no prior history of Echocardiogram examinations.                  Risk Factors:Hypertension and Diabetes.  Sonographer:     Sedonia Small Rodgers-Jones Referring Phys:  [id number] NIU Diagnosing Phys: Alwyn Pea MD IMPRESSIONS  1. Left ventricular ejection fraction, by estimation, is 60 to 65%. The left ventricle has normal function. The left ventricle has no regional wall motion abnormalities. Left ventricular diastolic parameters are consistent with Grade III diastolic dysfunction (restrictive).  2. Right ventricular systolic function is normal. The right ventricular size is normal.  3. The mitral valve is normal in structure. No evidence of mitral valve regurgitation.  4. The aortic valve is grossly normal. Aortic valve regurgitation is not visualized. FINDINGS  Left Ventricle: Left ventricular ejection fraction, by estimation, is 60 to 65%. The left ventricle has normal function. The left ventricle has no regional wall motion abnormalities. The left ventricular internal cavity size was normal in size. There is  no left ventricular hypertrophy. Left ventricular diastolic parameters are consistent with Grade III diastolic dysfunction (restrictive). Right Ventricle: The right ventricular size is normal. No increase in right ventricular wall thickness. Right ventricular systolic function is normal. Left Atrium: Left atrial size was normal in size. Right Atrium: Right atrial size was normal in size. Pericardium: There is no evidence of pericardial effusion. Mitral Valve: The mitral valve is normal in structure. No evidence of mitral valve regurgitation.  Tricuspid Valve: The tricuspid valve is normal in  structure. Tricuspid valve regurgitation is not demonstrated. Aortic Valve: The aortic valve is grossly normal. Aortic valve regurgitation is not visualized. Aortic valve mean gradient measures 8.7 mmHg. Aortic valve peak gradient measures 15.0 mmHg. Aortic valve area, by VTI measures 2.01 cm. Pulmonic Valve: The pulmonic valve was normal in structure. Pulmonic valve regurgitation is not visualized. Aorta: The ascending aorta was not well visualized. IAS/Shunts: No atrial level shunt detected by color flow Doppler.  LEFT VENTRICLE PLAX 2D LVIDd:         4.47 cm  Diastology LVIDs:         2.90 cm  LV e' medial:    6.53 cm/s LV PW:         0.79 cm  LV E/e' medial:  11.4 LV IVS:        0.74 cm  LV e' lateral:   9.25 cm/s LVOT diam:     2.00 cm  LV E/e' lateral: 8.1 LV SV:         82 LV SV Index:   38 LVOT Area:     3.14 cm  RIGHT VENTRICLE RV Basal diam:  3.51 cm RV S prime:     18.00 cm/s TAPSE (M-mode): 2.2 cm LEFT ATRIUM             Index       RIGHT ATRIUM           Index LA diam:        4.50 cm 2.07 cm/m  RA Area:     13.50 cm LA Vol (A2C):   35.1 ml 16.11 ml/m RA Volume:   32.60 ml  14.96 ml/m LA Vol (A4C):   40.8 ml 18.73 ml/m LA Biplane Vol: 39.3 ml 18.04 ml/m  AORTIC VALVE AV Area (Vmax):    1.88 cm AV Area (Vmean):   1.93 cm AV Area (VTI):     2.01 cm AV Vmax:           193.33 cm/s AV Vmean:          139.333 cm/s AV VTI:            0.407 m AV Peak Grad:      15.0 mmHg AV Mean Grad:      8.7 mmHg LVOT Vmax:         115.50 cm/s LVOT Vmean:        85.550 cm/s LVOT VTI:          0.260 m LVOT/AV VTI ratio: 0.64  AORTA Ao Root diam: 3.60 cm MITRAL VALVE MV Area (PHT): 2.62 cm     SHUNTS MV Decel Time: 289 msec     Systemic VTI:  0.26 m MV E velocity: 74.70 cm/s   Systemic Diam: 2.00 cm MV A velocity: 122.00 cm/s MV E/A ratio:  0.61 Dwayne D Callwood MD Electronically signed by Alwyn Pea MD Signature Date/Time: 09/22/2020/7:11:38 AM    Final       Subjective: No acute issues or events noted,  patient tolerating p.o. at bedside this morning sitting up with speech evaluating, son in the bedroom as well.  Patient denies any issues although review of systems somewhat limited given his baseline mental status.   Discharge Exam: Vitals:   09/23/20 0431 09/23/20 0813  BP: (!) 118/59 (!) 146/81  Pulse: 66 66  Resp: 17 17  Temp: 98.1 F (36.7 C) 98.5 F (36.9 C)  SpO2: 97% 95%  Vitals:   09/23/20 0008 09/23/20 0216 09/23/20 0431 09/23/20 0813  BP: (!) 154/72 (!) 166/82 (!) 118/59 (!) 146/81  Pulse: 67 69 66 66  Resp: 18 16 17 17   Temp: 98.9 F (37.2 C) 98.6 F (37 C) 98.1 F (36.7 C) 98.5 F (36.9 C)  TempSrc:      SpO2: 98% 97% 97% 95%  Weight:      Height:        General: Pt is alert, awake, not in acute distress Cardiovascular: RRR, S1/S2 +, no rubs, no gallops Respiratory: CTA bilaterally, no wheezing, no rhonchi Abdominal: Soft, NT, ND, bowel sounds + Extremities: no edema, no cyanosis    The results of significant diagnostics from this hospitalization (including imaging, microbiology, ancillary and laboratory) are listed below for reference.     Microbiology: Recent Results (from the past 240 hour(s))  SARS CORONAVIRUS 2 (TAT 6-24 HRS) Nasopharyngeal Nasopharyngeal Swab     Status: None   Collection Time: 09/21/20 12:30 PM   Specimen: Nasopharyngeal Swab  Result Value Ref Range Status   SARS Coronavirus 2 NEGATIVE NEGATIVE Final    Comment: (NOTE) SARS-CoV-2 target nucleic acids are NOT DETECTED.  The SARS-CoV-2 RNA is generally detectable in upper and lower respiratory specimens during the acute phase of infection. Negative results do not preclude SARS-CoV-2 infection, do not rule out co-infections with other pathogens, and should not be used as the sole basis for treatment or other patient management decisions. Negative results must be combined with clinical observations, patient history, and epidemiological information. The expected result is  Negative.  Fact Sheet for Patients: HairSlick.nohttps://www.fda.gov/media/138098/download  Fact Sheet for Healthcare Providers: quierodirigir.comhttps://www.fda.gov/media/138095/download  This test is not yet approved or cleared by the [place] FDA and  has been authorized for detection and/or diagnosis of SARS-CoV-2 by FDA under an Emergency Use Authorization (EUA). This EUA will remain  in effect (meaning this test can be used) for the duration of the COVID-19 declaration under Se ction 564(b)(1) of the Act, 21 U.S.C. section 360bbb-3(b)(1), unless the authorization is terminated or revoked sooner.  Performed at [facility], [address] [place], [place] [address]      Labs: BNP (last 3 results) No results for input(s): BNP in the last 8760 hours. Basic Metabolic Panel: Recent Labs  Lab 09/21/20 1155 09/22/20 0437 09/23/20 0621  NA 140 143 142  K 4.4 3.7 4.1  CL 106 107 106  CO2 25 27 25   GLUCOSE 125* 113* 118*  BUN 11 14 19   CREATININE 1.04 1.18 1.04  CALCIUM 9.1 8.8* 9.0   Liver Function Tests: Recent Labs  Lab 09/21/20 1155 09/23/20 0621  AST 27 31  ALT 20 19  ALKPHOS 108 93  BILITOT 1.5* 1.5*  PROT 7.1 6.3*  ALBUMIN 3.8 3.4*   No results for input(s): LIPASE, AMYLASE in the last 168 hours. No results for input(s): AMMONIA in the last 168 hours. CBC: Recent Labs  Lab 09/21/20 1155 09/22/20 0437 09/23/20 0621  WBC 6.0 5.4 8.1  NEUTROABS 4.2  --   --   HGB 13.4 12.2* 12.7*  HCT 40.8 38.2* 38.1*  MCV 92.7 94.1 92.0  PLT 105* 84* 92*   Cardiac Enzymes: No results for input(s): CKTOTAL, CKMB, CKMBINDEX, TROPONINI in the last 168 hours. BNP: Invalid input(s): POCBNP CBG: Recent Labs  Lab 09/22/20 0804 09/22/20 1244 09/22/20 1625 09/22/20 2103 09/23/20 0806  GLUCAP 120* 178* 90 113* 117*   D-Dimer No results for input(s): DDIMER in  the last 72 hours. Hgb A1c Recent Labs    09/22/20 0437  HGBA1C 5.8*   Lipid Profile Recent Labs     09/22/20 0437  CHOL 170  HDL 39*  LDLCALC 107*  TRIG 118  CHOLHDL 4.4   Thyroid function studies No results for input(s): TSH, T4TOTAL, T3FREE, THYROIDAB in the last 72 hours.  Invalid input(s): FREET3 Anemia work up No results for input(s): VITAMINB12, FOLATE, FERRITIN, TIBC, IRON, RETICCTPCT in the last 72 hours. Urinalysis    Component Value Date/Time   COLORURINE YELLOW (A) 09/21/2020 1345   APPEARANCEUR HAZY (A) 09/21/2020 1345   APPEARANCEUR Clear 09/06/2013 0739   LABSPEC 1.019 09/21/2020 1345   LABSPEC 1.020 09/06/2013 0739   PHURINE 5.0 09/21/2020 1345   GLUCOSEU NEGATIVE 09/21/2020 1345   GLUCOSEU Negative 09/06/2013 0739   HGBUR NEGATIVE 09/21/2020 1345   BILIRUBINUR NEGATIVE 09/21/2020 1345   BILIRUBINUR Negative 09/06/2013 0739   KETONESUR NEGATIVE 09/21/2020 1345   PROTEINUR NEGATIVE 09/21/2020 1345   NITRITE NEGATIVE 09/21/2020 1345   LEUKOCYTESUR TRACE (A) 09/21/2020 1345   LEUKOCYTESUR Negative 09/06/2013 0739   Sepsis Labs Invalid input(s): PROCALCITONIN,  WBC,  LACTICIDVEN Microbiology Recent Results (from the past 240 hour(s))  SARS CORONAVIRUS 2 (TAT 6-24 HRS) Nasopharyngeal Nasopharyngeal Swab     Status: None   Collection Time: 09/21/20 12:30 PM   Specimen: Nasopharyngeal Swab  Result Value Ref Range Status   SARS Coronavirus 2 NEGATIVE NEGATIVE Final    Comment: (NOTE) SARS-CoV-2 target nucleic acids are NOT DETECTED.  The SARS-CoV-2 RNA is generally detectable in upper and lower respiratory specimens during the acute phase of infection. Negative results do not preclude SARS-CoV-2 infection, do not rule out co-infections with other pathogens, and should not be used as the sole basis for treatment or other patient management decisions. Negative results must be combined with clinical observations, patient history, and epidemiological information. The expected result is Negative.  Fact Sheet for  Patients: HairSlick.no  Fact Sheet for Healthcare Providers: quierodirigir.com  This test is not yet approved or cleared by the [place] FDA and  has been authorized for detection and/or diagnosis of SARS-CoV-2 by FDA under an Emergency Use Authorization (EUA). This EUA will remain  in effect (meaning this test can be used) for the duration of the COVID-19 declaration under Se ction 564(b)(1) of the Act, 21 U.S.C. section 360bbb-3(b)(1), unless the authorization is terminated or revoked sooner.  Performed at [facility], [address] [place], [place] [address]      Time coordinating discharge: Over 30 minutes  SIGNED:   Azucena Fallen, DO [facility] 09/23/2020, 10:31 AM Pager   If 7PM-7AM, please contact night-coverage [email or web address]

## 2020-09-28 ENCOUNTER — Telehealth: Payer: Self-pay | Admitting: *Deleted

## 2020-09-28 NOTE — Telephone Encounter (Signed)
Received alert from [employer] at 7:17PM 2/15 that pt's cardiac monitor fell off and did not stick after troubleshooting. Made note to overnight a replacement to be shipped to the rehab facility the pt is currently staying.   Pt discharged from [facility] 09/23/20 to [facility] s/p CVA.  Live Zio AT cardiac monitor placed in hospital 2/10 to be worn 14 days.  Ordered by Eula Listen, PA (dx CVA) and to be read by Dr. Mariah Milling.  Pt to follow up with neurology.   Attempted to contact pt's emergency contact per [employer], pt's son Clifford Benninger @ [phone].  No answer, left message on son's vm asking to call our office. Will make sure son aware of event.   Per [employer] no significant recordings confirmed, as monitor was not transmitting properly while it was recording.  Will ensure son aware that monitor that fell off will need to be mailed back and to ensure monitor en route to be placed.

## 2020-09-30 NOTE — Telephone Encounter (Signed)
Spoke to pt's son Freida Busman this morning. He is aware that the second zio monitor is to arrive today by 4:30 PM to [facility].  Freida Busman is going to be present to ensure monitor arrives and is placed according to instructions.  Advised Allen may contact [employer] or myself if he has any questions regarding placement.  Freida Busman confirmed that he is going to mail back the first monitor.  He has no further questions at this time, and is appreciative of information and follow up.

## 2020-10-03 ENCOUNTER — Telehealth: Payer: Self-pay

## 2020-10-03 NOTE — Telephone Encounter (Signed)
Called patient to call patient to schedule hospital follow up , VM full.

## 2020-10-03 NOTE — Telephone Encounter (Signed)
-----   Message from Sondra Barges, PA-C sent at 09/22/2020 12:51 PM EST ----- Another one that needs hospital follow up with MD only in 4-6 weeks

## 2020-10-04 ENCOUNTER — Emergency Department: Payer: Medicare Other

## 2020-10-04 ENCOUNTER — Emergency Department
Admission: EM | Admit: 2020-10-04 | Discharge: 2020-10-04 | Disposition: A | Discharge status: Home / Self Care | Payer: Medicare Other | Attending: Emergency Medicine | Admitting: Emergency Medicine

## 2020-10-04 ENCOUNTER — Encounter: Payer: Self-pay | Admitting: Emergency Medicine

## 2020-10-04 DIAGNOSIS — E860 Dehydration: Secondary | ICD-10-CM

## 2020-10-04 DIAGNOSIS — N179 Acute kidney failure, unspecified: Secondary | ICD-10-CM | POA: Insufficient documentation

## 2020-10-04 DIAGNOSIS — Z8673 Personal history of transient ischemic attack (TIA), and cerebral infarction without residual deficits: Secondary | ICD-10-CM | POA: Diagnosis not present

## 2020-10-04 DIAGNOSIS — Z87891 Personal history of nicotine dependence: Secondary | ICD-10-CM | POA: Insufficient documentation

## 2020-10-04 DIAGNOSIS — E119 Type 2 diabetes mellitus without complications: Secondary | ICD-10-CM | POA: Insufficient documentation

## 2020-10-04 DIAGNOSIS — F0390 Unspecified dementia without behavioral disturbance: Secondary | ICD-10-CM | POA: Insufficient documentation

## 2020-10-04 DIAGNOSIS — R531 Weakness: Secondary | ICD-10-CM | POA: Diagnosis present

## 2020-10-04 DIAGNOSIS — Z7984 Long term (current) use of oral hypoglycemic drugs: Secondary | ICD-10-CM | POA: Insufficient documentation

## 2020-10-04 DIAGNOSIS — I10 Essential (primary) hypertension: Secondary | ICD-10-CM | POA: Insufficient documentation

## 2020-10-04 DIAGNOSIS — Z79899 Other long term (current) drug therapy: Secondary | ICD-10-CM | POA: Insufficient documentation

## 2020-10-04 DIAGNOSIS — Z7982 Long term (current) use of aspirin: Secondary | ICD-10-CM | POA: Insufficient documentation

## 2020-10-04 LAB — CBC
HCT: 35.9 % — ABNORMAL LOW (ref 39.0–52.0)
Hemoglobin: 11.8 g/dL — ABNORMAL LOW (ref 13.0–17.0)
MCH: 30.8 pg (ref 26.0–34.0)
MCHC: 32.9 g/dL (ref 30.0–36.0)
MCV: 93.7 fL (ref 80.0–100.0)
Platelets: 100 10*3/uL — ABNORMAL LOW (ref 150–400)
RBC: 3.83 MIL/uL — ABNORMAL LOW (ref 4.22–5.81)
RDW: 14.9 % (ref 11.5–15.5)
WBC: 6.5 10*3/uL (ref 4.0–10.5)
nRBC: 0 % (ref 0.0–0.2)

## 2020-10-04 LAB — URINALYSIS, COMPLETE (UACMP) WITH MICROSCOPIC
Bilirubin Urine: NEGATIVE
Glucose, UA: NEGATIVE mg/dL
Ketones, ur: NEGATIVE mg/dL
Nitrite: NEGATIVE
Protein, ur: NEGATIVE mg/dL
Specific Gravity, Urine: 1.018 (ref 1.005–1.030)
pH: 5 (ref 5.0–8.0)

## 2020-10-04 LAB — BASIC METABOLIC PANEL
Anion gap: 10 (ref 5–15)
BUN: 19 mg/dL (ref 8–23)
CO2: 20 mmol/L — ABNORMAL LOW (ref 22–32)
Calcium: 8.8 mg/dL — ABNORMAL LOW (ref 8.9–10.3)
Chloride: 106 mmol/L (ref 98–111)
Creatinine, Ser: 1.7 mg/dL — ABNORMAL HIGH (ref 0.61–1.24)
GFR, Estimated: 38 mL/min — ABNORMAL LOW (ref >60)
Glucose, Bld: 247 mg/dL — ABNORMAL HIGH (ref 70–99)
Potassium: 3.8 mmol/L (ref 3.5–5.1)
Sodium: 136 mmol/L (ref 135–145)

## 2020-10-04 MED ORDER — SODIUM CHLORIDE 0.9 % IV BOLUS
1000.0000 mL | Freq: Once | INTRAVENOUS | Status: AC
  Administered 2020-10-04: 1000 mL via INTRAVENOUS

## 2020-10-04 NOTE — ED Provider Notes (Signed)
[facility] Emergency Department Provider Note   ____________________________________________   Event Date/Time   First MD Initiated Contact with Patient 10/04/20 1633     (approximate)  I have reviewed the triage vital signs and the nursing notes.   HISTORY  Chief Complaint Fatigue  EM caveat: Patient with dementia, history primarily comes from the patient's son  HPI Paul Martinez is a 87 y.o. male recently discharged after having a large vessel stroke.  He has recovered quite well and is now living in assisted living   Son reports that father was noticed today to see him fatigue.  Week, and does not appear to be drinking much fluids  He has recovered well with regard to his sensation and strength in his arms and legs.  He wishes initially having weakness but is recovered this well with only some very slight weakness left in his left hand.  Patient himself reports he feels okay no complaints at this time.  He is in a new assisted living center and they deliver him food and water but noted that he does not have someone there constantly assisting to make sure that he eats it and feel that he is not drinking enough fluids  Son reports the urgent care was concerned about him, and reports though that his father seems to be acting to his normal and actually is recovering pretty well from his stroke without new deficits and just seems fatigued and probably not drinking enough fluids  Past Medical History:  Diagnosis Date  . Diabetes mellitus without complication (HCC)   . GERD (gastroesophageal reflux disease)   . Hypertension   . Thrombocytopenia [facility])     Patient Active Problem List   Diagnosis Date Noted  . Left arm weakness 09/21/2020  . Dementia (HCC) 09/21/2020  . Stroke (HCC) 09/21/2020  . Diabetes mellitus without complication (HCC)   . Hypertension   . GERD (gastroesophageal reflux disease)   . Depression   . Thrombocytopenia (HCC)   .  ICH (intracerebral hemorrhage) (HCC) 09/30/2017  . DIZZINESS 02/23/2010  . DYSPNEA 02/23/2010  . ABNORMAL ELECTROCARDIOGRAM 02/23/2010    History reviewed. No pertinent surgical history.  Prior to Admission medications   Medication Sig Start Date End Date Taking? Authorizing Provider  acetaminophen (TYLENOL) 325 MG tablet Take 650 mg by mouth every 6 (six) hours as needed.    [provider]  aspirin EC 81 MG tablet Take 81 mg by mouth daily. Swallow whole.    [provider]  atorvastatin (LIPITOR) 40 MG tablet Take 1 tablet (40 mg total) by mouth daily. 09/23/20   Azucena Fallen, MD  buPROPion (WELLBUTRIN XL) 150 MG 24 hr tablet Take 150 mg by mouth every morning. 09/20/20   [provider]  clopidogrel (PLAVIX) 75 MG tablet Take 1 tablet (75 mg total) by mouth daily. 09/23/20   Azucena Fallen, MD  donepezil (ARICEPT) 10 MG tablet Take 10 mg by mouth at bedtime. 09/03/20   [provider]  glipiZIDE (GLUCOTROL) 10 MG tablet Take 10 mg by mouth daily before breakfast. 11/16/19   [provider]  iron polysaccharides (NIFEREX) 150 MG capsule Take 150 mg by mouth daily.    [provider]  lisinopril (PRINIVIL,ZESTRIL) 40 MG tablet Take 40 mg by mouth daily.    [provider]  metFORMIN (GLUCOPHAGE) 500 MG tablet Take 500 mg by mouth 2 (two) times daily with a meal.    [provider]  Multiple Vitamins-Minerals (PRESERVISION AREDS 2+MULTI VIT PO) Take 1 tablet by mouth 2 (two) times daily.    [provider]  pantoprazole (PROTONIX) 20 MG tablet Take 20 mg by mouth daily. 08/01/20   [provider]  sertraline (ZOLOFT) 50 MG tablet Take 100 mg by mouth daily.    [provider]  vitamin B-12 (CYANOCOBALAMIN) 1000 MCG tablet Take 1,000 mcg by mouth daily.    [provider]    Allergies Penicillins  Family History  Problem Relation Age of Onset  . Stroke Mother   . Stroke  Father   . Stroke Brother   . Cerebral aneurysm Brother     Social History Social History   Tobacco Use  . Smoking status: Former Games developer  . Smokeless tobacco: Never Used  Substance Use Topics  . Alcohol use: No  . Drug use: No    Review of Systems Constitutional: No fever/chills Eyes: No visual changes. ENT: No sore throat. Cardiovascular: Denies chest pain. Respiratory: Denies shortness of breath. Gastrointestinal: No abdominal pain.   Musculoskeletal: Negative for back pain.  Has had some chronic pain around the left posterior neck which was present in the hospital as well, no worsening but occasionally will complain that it feels sore in an area Skin: Negative for rash. Neurological: Negative for headaches, areas of focal weakness or numbness except for some slight weakness in the left hand that seems to be improving.  His major deficits that he experienced when his stroke came have improved quite a lot he seems to be making forward progress    ____________________________________________   PHYSICAL EXAM:  VITAL SIGNS: ED Triage Vitals  Enc Vitals Group     BP 10/04/20 1414 (!) 114/52     Pulse Rate 10/04/20 1414 65     Resp 10/04/20 1414 18     Temp 10/04/20 1414 98.5 F (36.9 C)     Temp Source 10/04/20 1414 Oral     SpO2 10/04/20 1414 95 %     Weight 10/04/20 1416 216 lb 0.8 oz (98 kg)     Height 10/04/20 1416 5\' 11"  (1.803 m)     Head Circumference --      Peak Flow --      Pain Score --      Pain Loc --      Pain Edu? --      Excl. in GC? --     Constitutional: Alert and oriented to sign and self but not to year. Well appearing and in no acute distress.  He is very pleasant without evidence of psychomotor agitation. Eyes: Conjunctivae are normal. Head: Atraumatic. Nose: No congestion/rhinnorhea. Mouth/Throat: Mucous membranes are moist. Neck: No stridor.  Cardiovascular: Normal rate, regular rhythm. Grossly normal heart sounds.  Good peripheral  circulation. Respiratory: Normal respiratory effort.  No retractions. Lungs CTAB. Gastrointestinal: Soft and nontender. No distention. Musculoskeletal: No lower extremity tenderness nor edema. Neurologic:  Normal speech and language. No gross focal neurologic deficits are appreciated.  Patient demonstrates 5 out of 5 strength in all extremities.  He demonstrates normal sensation in the hands arms and legs face bilateral.  He does not appear to have any apparent large or significant deficits except for some still slight weakness in the left hand but evidently this has been present since his stroke.  Patient is here to have them remarkably good neurologic exam considering the gravity of his recent stroke, he does appear to be recovering well without evidence of acute  deficit. Skin:  Skin is warm, dry and intact. No rash noted. Psychiatric: Mood and affect are normal. Speech and behavior are normal.  ____________________________________________   LABS (all labs ordered are listed, but only abnormal results are displayed)  Labs Reviewed  BASIC METABOLIC PANEL - Abnormal; Notable for the following components:      Result Value   CO2 20 (*)    Glucose, Bld 247 (*)    Creatinine, Ser 1.70 (*)    Calcium 8.8 (*)    GFR, Estimated 38 (*)    All other components within normal limits  CBC - Abnormal; Notable for the following components:   RBC 3.83 (*)    Hemoglobin 11.8 (*)    HCT 35.9 (*)    Platelets 100 (*)    All other components within normal limits  URINALYSIS, COMPLETE (UACMP) WITH MICROSCOPIC - Abnormal; Notable for the following components:   Color, Urine YELLOW (*)    APPearance CLEAR (*)    Hgb urine dipstick SMALL (*)    Leukocytes,Ua SMALL (*)    Bacteria, UA RARE (*)    All other components within normal limits  URINE CULTURE  CBG MONITORING, ED   ____________________________________________  EKG  Reviewed inter by me at 1430 Heart rate 79 QRS 99 QTc 470 Normal sinus  rhythm, mildly prolonged QT.  No evidence of acute ischemia denoted. ____________________________________________  RADIOLOGY  CT Head Wo Contrast  Result Date: 10/04/2020 CLINICAL DATA:  Change in mental status EXAM: CT HEAD WITHOUT CONTRAST TECHNIQUE: Contiguous axial images were obtained from the base of the skull through the vertex without intravenous contrast. COMPARISON:  September 23, 2020 FINDINGS: Brain: No evidence of acute territorial infarction, hemorrhage, hydrocephalus,extra-axial collection or mass lesion/mass effect. There is dilatation the ventricles and sulci consistent with age-related atrophy. Low-attenuation changes in the deep white matter consistent with small vessel ischemia. Vascular: No hyperdense vessel or unexpected calcification. Skull: The skull is intact. No fracture or focal lesion identified. Sinuses/Orbits: The visualized paranasal sinuses and mastoid air cells are clear. The orbits and globes intact. Other: None IMPRESSION: No acute intracranial abnormality. Findings consistent with age related atrophy and chronic small vessel ischemia Electronically Signed   By: Jonna ClarkBindu  Avutu M.D.   On: 10/04/2020 15:09    Imaging reviewed, negative for acute finding. ____________________________________________   PROCEDURES  Procedure(s) performed: None  Procedures  Critical Care performed: No  ____________________________________________   INITIAL IMPRESSION / ASSESSMENT AND PLAN / ED COURSE  Pertinent labs & imaging results that were available during my care of the patient were reviewed by me and considered in my medical decision making (see chart for details).   Patient with very reassuring work-up and exam.  I find no evidence of acute neuro deficits in fact it sounds as he seems to be recovering quite well from his recent stroke.  I do not see any evidence of an acute deficit today, but nursing staff at this facility report he was having difficulty placing his belt  on and given the patient's age, dementia, and possibly some dehydration which I suspect is present based on clinical history and laboratory analysis I think this may explain some of his symptoms and the fact that he had a recent stroke could also have caused some difficulty with getting his belt on etc.  Discussed with the patient and as well as his son at the bedside, and I think a reasonable plan that we have devised is to give him IV fluids, checking  urinalysis, and if he is doing well thereafter to discharge back to follow-up with his primary and continue his current treatment.  This seems in keeping with the patient and family's goals of care for him, as well as appropriate in his clinical situation.    Clinical Course as of 10/04/20 1905  Tue Oct 04, 2020  1752 Patient eating meal, no distress.  Await urinalysis [MQ]  1851 Patient urinating, await urinalysis result.  If this looks normal anticipate discharge to care facility and close outpatient follow-up. [MQ]    Clinical Course User Index [MQ] Sharyn Creamer, MD   ----------------------------------------- 7:05 PM on 10/04/2020 -----------------------------------------  Urinalysis sent for culture.  Frankly I do not see obvious evidence of UTI, and he is not endorsing symptoms but his culture were to grow back a pathogen I think treatment would be indicated.  Sent for culture.  Patient and son comfortable with plan for discharge back to his care home, they will make plans to follow-up with Dr. Marcello Fennel by end of week or early next week.  Return precautions and treatment recommendations and follow-up discussed with the patient who is agreeable with the plan.   ____________________________________________   FINAL CLINICAL IMPRESSION(S) / ED DIAGNOSES  Final diagnoses:  Dehydration  AKI (acute kidney injury) (HCC)        Note:  This document was prepared using Dragon voice recognition software and may include unintentional  dictation errors       Sharyn Creamer, MD 10/04/20 1906

## 2020-10-04 NOTE — ED Triage Notes (Signed)
Pt comes via EMS from [facility] with c/o AMs. EMs reports pt has severe dementia. Pt had recent stroke week ago with left sided weakness deficit. Pt was having trouble with belt and shoes and staff felt he needed to be evaluated.  VSS

## 2020-10-04 NOTE — ED Notes (Signed)
Pt unable to sign E-signature due to signature pad malfunction. Pt verbalized understanding of d/c instructions and had no additional questions or concerns for this RN or provider.Son at bedside  Pt left with d/c instructions and gathered all personal belongings from room and removed them prior to ED departure.

## 2020-10-04 NOTE — ED Notes (Signed)
On the phone with RN at [facility]. She states LSN yesterday around noon per son. She also noted that there is a note in his chart that numbness and tingling to LUE occurred on 2/19, resolved. Today, the same occurred and pt denies any numbness/tingling.

## 2020-10-07 LAB — URINE CULTURE: Culture: 20000 — AB

## 2020-10-08 NOTE — Progress Notes (Signed)
ED Antimicrobial Stewardship Positive Culture Follow Up   Paul Martinez is an 87 y.o. male who presented to [facility] on 10/04/2020 with a chief complaint of No chief complaint on file.   Recent Results (from the past 720 hour(s))  SARS CORONAVIRUS 2 (TAT 6-24 HRS) Nasopharyngeal Nasopharyngeal Swab     Status: None   Collection Time: 09/21/20 12:30 PM   Specimen: Nasopharyngeal Swab  Result Value Ref Range Status   SARS Coronavirus 2 NEGATIVE NEGATIVE Final    Comment: (NOTE) SARS-CoV-2 target nucleic acids are NOT DETECTED.  The SARS-CoV-2 RNA is generally detectable in upper and lower respiratory specimens during the acute phase of infection. Negative results do not preclude SARS-CoV-2 infection, do not rule out co-infections with other pathogens, and should not be used as the sole basis for treatment or other patient management decisions. Negative results must be combined with clinical observations, patient history, and epidemiological information. The expected result is Negative.  Fact Sheet for Patients: HairSlick.no  Fact Sheet for Healthcare Providers: quierodirigir.com  This test is not yet approved or cleared by the [place] FDA and  has been authorized for detection and/or diagnosis of SARS-CoV-2 by FDA under an Emergency Use Authorization (EUA). This EUA will remain  in effect (meaning this test can be used) for the duration of the COVID-19 declaration under Se ction 564(b)(1) of the Act, 21 U.S.C. section 360bbb-3(b)(1), unless the authorization is terminated or revoked sooner.  Performed at [facility], [address], [place], [place] [address]   Urine Culture     Status: Abnormal   Collection Time: 10/04/20  6:37 PM   Specimen: Urine, Clean Catch  Result Value Ref Range Status   Specimen Description   Final    URINE, CLEAN CATCH Performed at [facility], [address],  [place], [place] [address]    Special Requests   Final    NONE Performed at [facility], [address] [place], [place] [address]    Culture 20,000 COLONIES/mL PROTEUS MIRABILIS (A)  Final   Report Status 10/07/2020 FINAL  Final   Organism ID, Bacteria PROTEUS MIRABILIS (A)  Final      Susceptibility   Proteus mirabilis - MIC*    AMPICILLIN <=2 SENSITIVE Sensitive     CEFAZOLIN <=4 SENSITIVE Sensitive     CEFEPIME <=0.12 SENSITIVE Sensitive     CEFTRIAXONE <=0.25 SENSITIVE Sensitive     CIPROFLOXACIN <=0.25 SENSITIVE Sensitive     GENTAMICIN <=1 SENSITIVE Sensitive     IMIPENEM 2 SENSITIVE Sensitive     NITROFURANTOIN 128 RESISTANT Resistant     TRIMETH/SULFA <=20 SENSITIVE Sensitive     AMPICILLIN/SULBACTAM <=2 SENSITIVE Sensitive     PIP/TAZO <=4 SENSITIVE Sensitive     * 20,000 COLONIES/mL PROTEUS MIRABILIS   87 yo M presented with AMS, dehydration, and fatigue. Pt had a recent stroke (1 week before admission). PMH includes severe dementia. Pt reported no symptoms at this time. Recommended not treating, EDP agreed.   New antibiotic prescription: None  ED Provider: Tyrone Sage, PharmD Pharmacy Resident  10/08/2020 2:18 PM

## 2020-10-25 NOTE — Addendum Note (Signed)
Encounter addended by: Oneida Arenas on: 10/25/2020 11:56 AM  Actions taken: Imaging Exam ended

## 2020-10-27 ENCOUNTER — Telehealth: Payer: Self-pay | Admitting: *Deleted

## 2020-10-27 NOTE — Telephone Encounter (Signed)
No answer/Voicemail box is full.  

## 2020-10-27 NOTE — Telephone Encounter (Signed)
-----   Message from Sondra Barges, [place] sent at 10/27/2020  7:29 AM EDT ----- Please inform patient his heart monitors showed sinus rhythm predominantly with some episodes of SVT (fast heart rhythm coming from the top portion of the heart). The longest episode of SVT of only 7 beats. There were occasional PACs and rare PVCs. He needs to follow up with MD only to establish care.

## 2020-10-28 NOTE — Telephone Encounter (Signed)
Reviewed monitor results and recommendations. Scheduled follow up hospital visit with Dr. Mariah Milling. He verbalized understanding and confirmed upcoming appointment.

## 2020-10-28 NOTE — Telephone Encounter (Signed)
Patient returning call.

## 2020-10-28 NOTE — Telephone Encounter (Signed)
Left voicemail message to call back for review of results.  

## 2020-11-06 NOTE — Progress Notes (Signed)
Cardiology Office Note  Date:  11/07/2020   ID:  Paul Martinez, DOB 01/09/1930, MRN [id number]  PCP:  Barbette Reichmann, MD   Chief Complaint  Patient presents with  . Hospitalization Follow-up    HPI:  Paul Martinez is a 87 y.o. male with PMH of dementia, hypertension, diabetes mellitus, GERD, depression,  ICH,acute right frontal and parafalcine subdural hemorrhages on 09/30/2017 after mechanical fall.   thrombocytopenia  large vessel stroke 2/22, seen by neurology, loaded with plavix in hospital Continued falls Who presents for new patient evaluation for recent CVA, need for monitoring  Recent events reviewed, hospital records also requested and reviewed MRI shows acute ischemic nonhemorrhagic right MCA,  Following stroke he was discharged to assisted living Was not eating or drinking well Went back to the ER 10/04/2020: dehydration Treated with fluids  Son presents with him today, reports that he is now at home  Carotid u/s Right ICA narrowing less than 50% Left ICA stenosis estimated at 50-69%  Echo: read by outside cardiology 1. Left ventricular ejection fraction, by estimation, is 60 to 65%. The  left ventricle has normal function. The left ventricle has no regional  wall motion abnormalities. Left ventricular diastolic parameters are  consistent with Grade III diastolic  dysfunction (restrictive).   Had prior monitor placed: One lasted 1-2 days, Second monitor, did not stick  A1C 6.0 Total cholo 186, LDL 118, (not on statin)  EKG personally reviewed by myself on todays visit Shows normal sinus rhythm with rate 63 bpm   PMH:   has a past medical history of Diabetes mellitus without complication (HCC), GERD (gastroesophageal reflux disease), Hypertension, Stroke (HCC), and Thrombocytopenia (HCC).  PSH:   History reviewed. No pertinent surgical history.  Current Outpatient Medications  Medication Sig Dispense Refill  . acetaminophen (TYLENOL) 325 MG  tablet Take 650 mg by mouth every 6 (six) hours as needed.    Marland Kitchen aspirin EC 81 MG tablet Take 81 mg by mouth daily. Swallow whole.    Marland Kitchen atorvastatin (LIPITOR) 40 MG tablet Take 1 tablet (40 mg total) by mouth daily. 30 tablet 0  . buPROPion (WELLBUTRIN XL) 150 MG 24 hr tablet Take 150 mg by mouth every morning.    . clopidogrel (PLAVIX) 75 MG tablet Take 1 tablet (75 mg total) by mouth daily. 20 tablet 0  . donepezil (ARICEPT) 10 MG tablet Take 10 mg by mouth at bedtime.    Marland Kitchen glipiZIDE (GLUCOTROL) 10 MG tablet Take 10 mg by mouth daily before breakfast.    . iron polysaccharides (NIFEREX) 150 MG capsule Take 150 mg by mouth daily.    Marland Kitchen lisinopril (PRINIVIL,ZESTRIL) 40 MG tablet Take 40 mg by mouth daily.    . metFORMIN (GLUCOPHAGE) 500 MG tablet Take 500 mg by mouth 2 (two) times daily with a meal.    . Multiple Vitamins-Minerals (PRESERVISION AREDS 2+MULTI VIT PO) Take 1 tablet by mouth 2 (two) times daily.    . pantoprazole (PROTONIX) 20 MG tablet Take 20 mg by mouth daily.    . sertraline (ZOLOFT) 50 MG tablet Take 100 mg by mouth daily.    . vitamin B-12 (CYANOCOBALAMIN) 1000 MCG tablet Take 1,000 mcg by mouth daily.     No current facility-administered medications for this visit.     Allergies:   Penicillins   Social History:  The patient  reports that he has quit smoking. He has never used smokeless tobacco. He reports that he does not drink alcohol and does  not use drugs.   Family History:   family history includes Cerebral aneurysm in his brother; Stroke in his brother, father, and mother.    Review of Systems: Review of Systems  Constitutional: Negative.   HENT: Negative.   Respiratory: Negative.   Cardiovascular: Negative.   Gastrointestinal: Negative.   Musculoskeletal: Negative.   Neurological: Negative.   Psychiatric/Behavioral: Negative.   All other systems reviewed and are negative.    PHYSICAL EXAM: VS:  BP 140/70 (BP Location: Left Arm, Patient Position:  Sitting, Cuff Size: Large)   Pulse 75   Ht 5\' 11"  (1.803 m)   Wt 217 lb (98.4 kg)   SpO2 98%   BMI 30.27 kg/m  , BMI Body mass index is 30.27 kg/m. GEN: Well nourished, well developed, in no acute distress HEENT: normal Neck: no JVD, carotid bruits, or masses Cardiac: RRR; no murmurs, rubs, or gallops,no edema  Respiratory:  clear to auscultation bilaterally, normal work of breathing GI: soft, nontender, nondistended, + BS MS: no deformity or atrophy Skin: warm and dry, no rash Neuro:  Strength and sensation are intact Psych: euthymic mood, full affect   Recent Labs: 09/23/2020: ALT 19 10/04/2020: BUN 19; Creatinine, Ser 1.70; Hemoglobin 11.8; Platelets 100; Potassium 3.8; Sodium 136    Lipid Panel Lab Results  Component Value Date   CHOL 170 09/22/2020   HDL 39 (L) 09/22/2020   LDLCALC 107 (H) 09/22/2020   TRIG 118 09/22/2020      Wt Readings from Last 3 Encounters:  11/07/20 217 lb (98.4 kg)  10/04/20 216 lb 0.8 oz (98 kg)  09/21/20 216 lb 0.8 oz (98 kg)       ASSESSMENT AND PLAN:  Problem List Items Addressed This Visit      Cardiology Problems   Hypertension   Stroke [facility]) - Primary     Stroke Order a ZIO monitor but kept pulling it off, tried twice We only have 1 to 2 days of data showing normal sinus rhythm Neurology previously requested arrhythmia evaluation, -Discussed with patient's son, he is interested in further evaluation Will discuss with EP whether he is a candidate for a loop monitor We will continue aspirin Plavix, statin  Hypertension Continue current dose of lisinopril, family to watch blood pressures at home  Hyperlipidemia Now on a statin, will need repeat lipid panel 6 months Goal LDL less than 70  Carotid stenosis 50 to 60% noted, goal LDL less than 70 Currently on Lipitor  Diabetes type 2 Managed by primary care, on Metformin Weight stable  Dementia On Aricept Managed by neurology   Total encounter time more than 60  minutes  Greater than 50% was spent in counseling and coordination of care with the patient    Signed, [facility], M.D., Ph.D. [facility] [place], [place]

## 2020-11-07 ENCOUNTER — Other Ambulatory Visit: Payer: Self-pay

## 2020-11-07 ENCOUNTER — Telehealth: Payer: Self-pay | Admitting: Cardiovascular Disease

## 2020-11-07 ENCOUNTER — Ambulatory Visit: Payer: Medicare Other | Admitting: Cardiovascular Disease

## 2020-11-07 ENCOUNTER — Encounter: Payer: Self-pay | Admitting: Cardiovascular Disease

## 2020-11-07 VITALS — BP 140/70 | HR 75 | Ht 71.0 in | Wt 217.0 lb

## 2020-11-07 DIAGNOSIS — I10 Essential (primary) hypertension: Secondary | ICD-10-CM | POA: Diagnosis not present

## 2020-11-07 DIAGNOSIS — I639 Cerebral infarction, unspecified: Secondary | ICD-10-CM | POA: Diagnosis not present

## 2020-11-07 DIAGNOSIS — R9431 Abnormal electrocardiogram [ECG] [EKG]: Secondary | ICD-10-CM

## 2020-11-07 DIAGNOSIS — R42 Dizziness and giddiness: Secondary | ICD-10-CM | POA: Diagnosis not present

## 2020-11-07 NOTE — Patient Instructions (Addendum)
We will talk with Electric Doctor EP-Dr. Lalla Brothers) to see if we can have him place a Loop monitor  Will talk to precert for approval for insurance for the heart monitor device  Medication Instructions:  No changes-continue current medications  Lab work: No new labs needed  Testing/Procedures: No new testing needed   Follow-Up:   . You will need a follow up appointment as needed  . Providers on your designated Care Team:   . Nicolasa Ducking, NP . Eula Listen, PA-C . Marisue Ivan, PA-C

## 2020-11-07 NOTE — Telephone Encounter (Signed)
-----   Message from Antonieta Iba, MD sent at 11/06/2020 11:31 AM EDT ----- Monday AM, he is a new patient Thx TG

## 2020-11-07 NOTE — Telephone Encounter (Signed)
Attempted to change to New .  Unable to do so after visit complete.  Placed help desk ticket to change to new for appropriate billing.

## 2020-11-30 ENCOUNTER — Encounter: Payer: Self-pay | Admitting: Cardiology

## 2020-11-30 ENCOUNTER — Other Ambulatory Visit: Payer: Self-pay

## 2020-11-30 ENCOUNTER — Ambulatory Visit: Payer: Medicare Other | Admitting: Cardiology

## 2020-11-30 DIAGNOSIS — I639 Cerebral infarction, unspecified: Secondary | ICD-10-CM | POA: Diagnosis not present

## 2020-11-30 DIAGNOSIS — F0390 Unspecified dementia without behavioral disturbance: Secondary | ICD-10-CM | POA: Diagnosis not present

## 2020-11-30 NOTE — Progress Notes (Signed)
Electrophysiology Office Note:    Date:  11/30/2020   ID:  Paul Martinez, DOB 09-05-1929, MRN [id number]  PCP:  Barbette Reichmann, MD  [facility] Cardiologist:  No primary care provider on file.  [facility] Electrophysiologist:  Lanier Prude, MD   Referring MD: Antonieta Iba, MD   Chief Complaint: CVA  History of Present Illness:    Paul Martinez is a 86 y.o. male who presents for an evaluation of CVA at the request of Dr. Mariah Milling. Their medical history includes diabetes, dementia, hypertension, thrombocytopenia and stroke.  The patient last saw Dr. Mariah Milling November 07, 2020 and follow-up from his prior strokes.  The patient has previously worn a ZIO monitor on 2 separate occasions which has not shown evidence of atrial fibrillation.  The patient's son is actively involved in his care is interested in confirming that there is no atrial fibrillation predisposing the patient to developing strokes.  Past Medical History:  Diagnosis Date  . Diabetes mellitus without complication (HCC)   . GERD (gastroesophageal reflux disease)   . Hypertension   . Stroke (HCC)   . Thrombocytopenia (HCC)     No past surgical history on file.  Current Medications: Current Meds  Medication Sig  . acetaminophen (TYLENOL) 325 MG tablet Take 650 mg by mouth every 6 (six) hours as needed.  Marland Kitchen aspirin EC 81 MG tablet Take 81 mg by mouth daily. Swallow whole.  Marland Kitchen atorvastatin (LIPITOR) 40 MG tablet Take 1 tablet (40 mg total) by mouth daily.  Marland Kitchen buPROPion (WELLBUTRIN XL) 150 MG 24 hr tablet Take 150 mg by mouth every morning.  . clopidogrel (PLAVIX) 75 MG tablet Take 1 tablet (75 mg total) by mouth daily.  Marland Kitchen donepezil (ARICEPT) 10 MG tablet Take 10 mg by mouth at bedtime.  Marland Kitchen glipiZIDE (GLUCOTROL) 10 MG tablet Take 10 mg by mouth daily before breakfast.  . iron polysaccharides (NIFEREX) 150 MG capsule Take 150 mg by mouth daily.  Marland Kitchen lisinopril (ZESTRIL) 20 MG tablet Take 1 tablet by mouth daily.   . metFORMIN (GLUCOPHAGE) 500 MG tablet Take 500 mg by mouth 2 (two) times daily with a meal.  . Multiple Vitamins-Minerals (PRESERVISION AREDS 2+MULTI VIT PO) Take 1 tablet by mouth 2 (two) times daily.  . pantoprazole (PROTONIX) 20 MG tablet Take 20 mg by mouth daily.  . sertraline (ZOLOFT) 50 MG tablet Take 100 mg by mouth daily.  . vitamin B-12 (CYANOCOBALAMIN) 1000 MCG tablet Take 1,000 mcg by mouth daily.     Allergies:   Penicillins   Social History   Socioeconomic History  . Marital status: Single    Spouse name: Not on file  . Number of children: Not on file  . Years of education: Not on file  . Highest education level: Not on file  Occupational History  . Not on file  Tobacco Use  . Smoking status: Former Games developer  . Smokeless tobacco: Never Used  Vaping Use  . Vaping Use: Never used  Substance and Sexual Activity  . Alcohol use: No  . Drug use: No  . Sexual activity: Not on file  Other Topics Concern  . Not on file  Social History Narrative  . Not on file   Social Determinants of Health   Financial Resource Strain: Not on file  Food Insecurity: Not on file  Transportation Needs: Not on file  Physical Activity: Not on file  Stress: Not on file  Social Connections: Not on file  Family History: The patient's family history includes Cerebral aneurysm in his brother; Stroke in his brother, father, and mother.  ROS:   Please see the history of present illness.    All other systems reviewed and are negative.  EKGs/Labs/Other Studies Reviewed:    The following studies were reviewed today:  October 25, 2020 ZIO Monitor 1 Patient had a min HR of 55 bpm, max HR of 156 bpm, and avg HR of 69 bpm. Predominant underlying rhythm was Sinus Rhythm. 3 Supraventricular Tachycardia runs occurred, the run with the fastest interval lasting 5 beats with a max rate of 156 bpm, the  longest lasting 5 beats with an avg rate of 124 bpm. Isolated SVEs were occasional (3.1%,  4089), SVE Couplets were rare (<1.0%, 88), and SVE Triplets were rare (<1.0%, 10). Isolated VEs were rare (<1.0%), VE Couplets were rare (<1.0%), and no VE Triplets  were present.   Monitor 2 Patient had a min HR of 60 bpm, max HR of 102 bpm, and avg HR of 66 bpm. Predominant underlying rhythm was Sinus Rhythm. 1 run of Supraventricular Tachycardia occurred lasting 7 beats with a max rate of 102 bpm (avg 98 bpm). Isolated SVEs were occasional  (4.6%, 988), SVE Couplets were rare (<1.0%, 7), and no SVE Triplets were present. Isolated VEs were rare (<1.0%), and no VE Couplets or VE Triplets were present.   Paroxysmal SVTs noted on cardiac monitors.  No evidence of atrial fibrillation or atrial flutter noted to account for cerebrovascular incident.    September 21, 2020 echo Left ventricular function normal, 60% Right ventricular function normal No significant valvular abnormalities    EKG:  The ekg ordered today demonstrates sinus rhythm  Recent Labs: 09/23/2020: ALT 19 10/04/2020: BUN 19; Creatinine, Ser 1.70; Hemoglobin 11.8; Platelets 100; Potassium 3.8; Sodium 136  Recent Lipid Panel    Component Value Date/Time   CHOL 170 09/22/2020 0437   CHOL 176 09/07/2013 0505   TRIG 118 09/22/2020 0437   TRIG 231 (H) 09/07/2013 0505   HDL 39 (L) 09/22/2020 0437   HDL 30 (L) 09/07/2013 0505   CHOLHDL 4.4 09/22/2020 0437   VLDL 24 09/22/2020 0437   VLDL 46 (H) 09/07/2013 0505   LDLCALC 107 (H) 09/22/2020 0437   LDLCALC 100 09/07/2013 0505    Physical Exam:    VS:  BP 120/60   Pulse 66   Ht 5\' 11"  (1.803 m)   Wt 200 lb (90.7 kg)   SpO2 95%   BMI 27.89 kg/m     Wt Readings from Last 3 Encounters:  11/30/20 200 lb (90.7 kg)  11/07/20 217 lb (98.4 kg)  10/04/20 216 lb 0.8 oz (98 kg)     GEN:  Well nourished, well developed in no acute distress  HEENT: Normal NECK: No JVD; No carotid bruits LYMPHATICS: No lymphadenopathy CARDIAC: RRR, no murmurs, rubs, gallops RESPIRATORY:   Clear to auscultation without rales, wheezing or rhonchi  ABDOMEN: Soft, non-tender, non-distended MUSCULOSKELETAL:  No edema; No deformity  SKIN: Warm and dry NEUROLOGIC:  Alert and oriented to person and place.  Does display some short-term memory difficulties. PSYCHIATRIC:  Normal affect   ASSESSMENT:    1. Dementia without behavioral disturbance, unspecified dementia type (HCC)   2. Cerebrovascular accident (CVA), unspecified mechanism (HCC)    PLAN:    In order of problems listed above:  1. CVA Cryptogenic etiology.  No clear cause.  No evidence of atrial fibrillation on previous monitoring or EKGs.  Discussed  using loop recorder as a mechanism for ongoing surveillance for atrial fibrillation with the patient and his son who is involved in his care.  They would like to proceed.  I discussed the risks of loop recorder during the visit.  Continue aspirin, atorvastatin and Plavix.  2.  Hypertension Controlled Continue home medications including lisinopril  3.  Dementia Continue donepezil  Medication Adjustments/Labs and Tests Ordered: Current medicines are reviewed at length with the patient today.  Concerns regarding medicines are outlined above.  Orders Placed This Encounter  Procedures  . EKG 12-Lead   No orders of the defined types were placed in this encounter.    Signed, Rossie Muskrat. Lalla Brothers, MD, [facility], [facility] 11/30/2020 4:59 PM    Electrophysiology [facility]    -----------------------------------------------------------------------------------  SURGEON:  Steffanie Dunn, MD    PREPROCEDURE DIAGNOSIS:  Cryptogenic stroke    POSTPROCEDURE DIAGNOSIS:  Cryptogenic stroke     PROCEDURES:   1. Implantable loop recorder implantation    INTRODUCTION:  Paul Martinez is a 87 y.o. patient with a history of cryptogenic stroke. Inpatient telemetry has been reviewed and not shown atrial fibrillation. The patient therefore presents today for  implantable loop implantation.     DESCRIPTION OF PROCEDURE:  Informed written consent was obtained.  The patient required no sedation for the procedure today.  Mapping over the patient's chest was performed to identify the area where electrograms were most prominent for ILR recording.  This area was found to be the left parasternal region over the 4th intercostal space. The patients left chest was therefore prepped and draped in the usual sterile fashion. The skin overlying the left parasternal region was infiltrated with lidocaine for local analgesia.  A 0.5-cm incision was made over the left parasternal region over the 3rd intercostal space.  A subcutaneous ILR pocket was fashioned using a combination of sharp and blunt dissection.  A Medtronic Reveal Linq model [id number]) implantable loop recorder was then placed into the pocket  R waves were very prominent and measured >0.31mV.  Steri- Strips and a sterile dressing were then applied.  There were no early apparent complications.     CONCLUSIONS:   1. Successful implantation of a Medtronic Reveal LINQ implantable loop recorder for a history of cryptogenic stroke  2. No early apparent complications.   Steffanie Dunn, MD 10/10/2020 3:51 PM

## 2020-11-30 NOTE — Patient Instructions (Signed)
Medication Instructions:  Your physician recommends that you continue on your current medications as directed. Please refer to the Current Medication list given to you today.  Labwork: None ordered.  Testing/Procedures: None ordered.  Follow-Up:  Your physician wants you to follow-up in: as needed with Dr. Lambert.     Implantable Loop Recorder Placement, Care After This sheet gives you information about how to care for yourself after your procedure. Your health care provider may also give you more specific instructions. If you have problems or questions, contact your health care provider. What can I expect after the procedure? After the procedure, it is common to have:  Soreness or discomfort near the incision.  Some swelling or bruising near the incision.  Follow these instructions at home: Incision care  1.  Leave your outer dressing on for 72 hours.  After 72 hours you can remove your outer dressing and shower. 2. Leave adhesive strips in place. These skin closures may need to stay in place for 1-2 weeks. If adhesive strip edges start to loosen and curl up, you may trim the loose edges.  You may remove the strips if they have not fallen off after 2 weeks. 3. Check your incision area every day for signs of infection. Check for: a. Redness, swelling, or pain. b. Fluid or blood. c. Warmth. d. Pus or a bad smell. 4. Do not take baths, swim, or use a hot tub until your incision is completely healed. 5. If your wound site starts to bleed apply pressure.      If you have any questions/concerns please call the device clinic at [phone].  Activity  Return to your normal activities.  General instructions  Follow instructions from your health care provider about how to manage your implantable loop recorder and transmit the information. Learn how to activate a recording if this is necessary for your type of device.  Do not go through a metal detection gate, and do not let  someone hold a metal detector over your chest. Show your ID card.  Do not have an MRI unless you check with your health care provider first.  Take over-the-counter and prescription medicines only as told by your health care provider.  Keep all follow-up visits as told by your health care provider. This is important. Contact a health care provider if:  You have redness, swelling, or pain around your incision.  You have a fever.  You have pain that is not relieved by your pain medicine.  You have triggered your device because of fainting (syncope) or because of a heartbeat that feels like it is racing, slow, fluttering, or skipping (palpitations). Get help right away if you have:  Chest pain.  Difficulty breathing. Summary  After the procedure, it is common to have soreness or discomfort near the incision.  Change your dressing as told by your health care provider.  Follow instructions from your health care provider about how to manage your implantable loop recorder and transmit the information.  Keep all follow-up visits as told by your health care provider. This is important. This information is not intended to replace advice given to you by your health care provider. Make sure you discuss any questions you have with your health care provider. Document Released: 07/11/2015 Document Revised: 09/14/2017 Document Reviewed: 09/14/2017 Elsevier Patient Education  2020 [employer]   

## 2020-12-13 ENCOUNTER — Emergency Department: Payer: Medicare Other

## 2020-12-13 ENCOUNTER — Inpatient Hospital Stay
Admission: EM | Admit: 2020-12-13 | Discharge: 2020-12-19 | DRG: 065 | Disposition: A | Discharge status: Skilled Nursing Facility | Payer: Medicare Other | Source: Skilled Nursing Facility | Attending: Internal Medicine | Admitting: Internal Medicine

## 2020-12-13 ENCOUNTER — Other Ambulatory Visit: Payer: Self-pay

## 2020-12-13 DIAGNOSIS — F0150 Vascular dementia without behavioral disturbance: Secondary | ICD-10-CM

## 2020-12-13 DIAGNOSIS — I10 Essential (primary) hypertension: Secondary | ICD-10-CM | POA: Diagnosis present

## 2020-12-13 DIAGNOSIS — R29703 NIHSS score 3: Secondary | ICD-10-CM | POA: Diagnosis present

## 2020-12-13 DIAGNOSIS — Z66 Do not resuscitate: Secondary | ICD-10-CM | POA: Diagnosis present

## 2020-12-13 DIAGNOSIS — Z20822 Contact with and (suspected) exposure to covid-19: Secondary | ICD-10-CM | POA: Diagnosis present

## 2020-12-13 DIAGNOSIS — R9082 White matter disease, unspecified: Secondary | ICD-10-CM | POA: Diagnosis present

## 2020-12-13 DIAGNOSIS — H9190 Unspecified hearing loss, unspecified ear: Secondary | ICD-10-CM | POA: Diagnosis present

## 2020-12-13 DIAGNOSIS — I639 Cerebral infarction, unspecified: Secondary | ICD-10-CM | POA: Diagnosis not present

## 2020-12-13 DIAGNOSIS — Z7982 Long term (current) use of aspirin: Secondary | ICD-10-CM | POA: Diagnosis not present

## 2020-12-13 DIAGNOSIS — Z87891 Personal history of nicotine dependence: Secondary | ICD-10-CM | POA: Diagnosis not present

## 2020-12-13 DIAGNOSIS — E785 Hyperlipidemia, unspecified: Secondary | ICD-10-CM | POA: Diagnosis present

## 2020-12-13 DIAGNOSIS — I6381 Other cerebral infarction due to occlusion or stenosis of small artery: Secondary | ICD-10-CM | POA: Diagnosis present

## 2020-12-13 DIAGNOSIS — Z6830 Body mass index (BMI) 30.0-30.9, adult: Secondary | ICD-10-CM | POA: Diagnosis not present

## 2020-12-13 DIAGNOSIS — R29706 NIHSS score 6: Secondary | ICD-10-CM | POA: Diagnosis not present

## 2020-12-13 DIAGNOSIS — Z880 Allergy status to penicillin: Secondary | ICD-10-CM | POA: Diagnosis not present

## 2020-12-13 DIAGNOSIS — Z823 Family history of stroke: Secondary | ICD-10-CM

## 2020-12-13 DIAGNOSIS — Z7902 Long term (current) use of antithrombotics/antiplatelets: Secondary | ICD-10-CM

## 2020-12-13 DIAGNOSIS — N179 Acute kidney failure, unspecified: Secondary | ICD-10-CM | POA: Diagnosis present

## 2020-12-13 DIAGNOSIS — Z7984 Long term (current) use of oral hypoglycemic drugs: Secondary | ICD-10-CM | POA: Diagnosis not present

## 2020-12-13 DIAGNOSIS — R531 Weakness: Secondary | ICD-10-CM | POA: Diagnosis present

## 2020-12-13 DIAGNOSIS — Z515 Encounter for palliative care: Secondary | ICD-10-CM | POA: Diagnosis not present

## 2020-12-13 DIAGNOSIS — K219 Gastro-esophageal reflux disease without esophagitis: Secondary | ICD-10-CM | POA: Diagnosis present

## 2020-12-13 DIAGNOSIS — R29704 NIHSS score 4: Secondary | ICD-10-CM | POA: Diagnosis not present

## 2020-12-13 DIAGNOSIS — W1839XA Other fall on same level, initial encounter: Secondary | ICD-10-CM | POA: Diagnosis present

## 2020-12-13 DIAGNOSIS — F0390 Unspecified dementia without behavioral disturbance: Secondary | ICD-10-CM | POA: Diagnosis present

## 2020-12-13 DIAGNOSIS — E119 Type 2 diabetes mellitus without complications: Secondary | ICD-10-CM | POA: Diagnosis not present

## 2020-12-13 DIAGNOSIS — Y92091 Bathroom in other non-institutional residence as the place of occurrence of the external cause: Secondary | ICD-10-CM | POA: Diagnosis not present

## 2020-12-13 DIAGNOSIS — Z8673 Personal history of transient ischemic attack (TIA), and cerebral infarction without residual deficits: Secondary | ICD-10-CM | POA: Diagnosis not present

## 2020-12-13 DIAGNOSIS — E669 Obesity, unspecified: Secondary | ICD-10-CM | POA: Diagnosis present

## 2020-12-13 DIAGNOSIS — Z79899 Other long term (current) drug therapy: Secondary | ICD-10-CM

## 2020-12-13 DIAGNOSIS — D696 Thrombocytopenia, unspecified: Secondary | ICD-10-CM | POA: Diagnosis present

## 2020-12-13 DIAGNOSIS — Z7189 Other specified counseling: Secondary | ICD-10-CM | POA: Diagnosis not present

## 2020-12-13 LAB — CBC
HCT: 36.4 % — ABNORMAL LOW (ref 39.0–52.0)
Hemoglobin: 12.1 g/dL — ABNORMAL LOW (ref 13.0–17.0)
MCH: 31.6 pg (ref 26.0–34.0)
MCHC: 33.2 g/dL (ref 30.0–36.0)
MCV: 95 fL (ref 80.0–100.0)
Platelets: 101 10*3/uL — ABNORMAL LOW (ref 150–400)
RBC: 3.83 MIL/uL — ABNORMAL LOW (ref 4.22–5.81)
RDW: 13.8 % (ref 11.5–15.5)
WBC: 8.7 10*3/uL (ref 4.0–10.5)
nRBC: 0 % (ref 0.0–0.2)

## 2020-12-13 LAB — GLUCOSE, CAPILLARY: Glucose-Capillary: 147 mg/dL — ABNORMAL HIGH (ref 70–99)

## 2020-12-13 LAB — COMPREHENSIVE METABOLIC PANEL
ALT: 21 U/L (ref 0–44)
AST: 31 U/L (ref 15–41)
Albumin: 3.5 g/dL (ref 3.5–5.0)
Alkaline Phosphatase: 101 U/L (ref 38–126)
Anion gap: 9 (ref 5–15)
BUN: 24 mg/dL — ABNORMAL HIGH (ref 8–23)
CO2: 23 mmol/L (ref 22–32)
Calcium: 9.1 mg/dL (ref 8.9–10.3)
Chloride: 110 mmol/L (ref 98–111)
Creatinine, Ser: 1.93 mg/dL — ABNORMAL HIGH (ref 0.61–1.24)
GFR, Estimated: 33 mL/min — ABNORMAL LOW (ref >60)
Glucose, Bld: 111 mg/dL — ABNORMAL HIGH (ref 70–99)
Potassium: 4.2 mmol/L (ref 3.5–5.1)
Sodium: 142 mmol/L (ref 135–145)
Total Bilirubin: 1.3 mg/dL — ABNORMAL HIGH (ref 0.3–1.2)
Total Protein: 6.4 g/dL — ABNORMAL LOW (ref 6.5–8.1)

## 2020-12-13 LAB — RESP PANEL BY RT-PCR (FLU A&B, COVID) ARPGX2
Influenza A by PCR: NEGATIVE
Influenza B by PCR: NEGATIVE
SARS Coronavirus 2 by RT PCR: NEGATIVE

## 2020-12-13 LAB — HEMOGLOBIN A1C
Hgb A1c MFr Bld: 5.8 % — ABNORMAL HIGH (ref 4.8–5.6)
Mean Plasma Glucose: 119.76 mg/dL

## 2020-12-13 LAB — CBG MONITORING, ED
Glucose-Capillary: 104 mg/dL — ABNORMAL HIGH (ref 70–99)
Glucose-Capillary: 71 mg/dL (ref 70–99)

## 2020-12-13 LAB — CK: Total CK: 67 U/L (ref 49–397)

## 2020-12-13 LAB — TROPONIN I (HIGH SENSITIVITY)
Troponin I (High Sensitivity): 10 ng/L (ref <18)
Troponin I (High Sensitivity): 10 ng/L (ref <18)

## 2020-12-13 MED ORDER — ACETAMINOPHEN 325 MG PO TABS: 650.0000 mg | ORAL_TABLET | ORAL | Status: DC | PRN

## 2020-12-13 MED ORDER — ACETAMINOPHEN 160 MG/5ML PO SOLN
650.0000 mg | ORAL | Status: DC | PRN
  Filled 2020-12-13: qty 20.3

## 2020-12-13 MED ORDER — CLOPIDOGREL BISULFATE 75 MG PO TABS
75.0000 mg | ORAL_TABLET | Freq: Every day | ORAL | Status: DC
  Administered 2020-12-14 – 2020-12-19 (×6): 75 mg via ORAL
  Filled 2020-12-13 (×6): qty 1

## 2020-12-13 MED ORDER — SODIUM CHLORIDE 0.9 % IV SOLN: INTRAVENOUS | Status: AC

## 2020-12-13 MED ORDER — SODIUM CHLORIDE 0.9 % IV BOLUS
1000.0000 mL | Freq: Once | INTRAVENOUS | Status: AC
  Administered 2020-12-13: 1000 mL via INTRAVENOUS

## 2020-12-13 MED ORDER — SERTRALINE HCL 50 MG PO TABS
100.0000 mg | ORAL_TABLET | Freq: Every day | ORAL | Status: DC
  Administered 2020-12-14 – 2020-12-19 (×6): 100 mg via ORAL
  Filled 2020-12-13 (×6): qty 2

## 2020-12-13 MED ORDER — BUPROPION HCL ER (SR) 150 MG PO TB12
150.0000 mg | ORAL_TABLET | Freq: Every day | ORAL | Status: DC
  Administered 2020-12-14 – 2020-12-19 (×6): 150 mg via ORAL
  Filled 2020-12-13 (×6): qty 1

## 2020-12-13 MED ORDER — ASPIRIN EC 81 MG PO TBEC
81.0000 mg | DELAYED_RELEASE_TABLET | Freq: Every day | ORAL | Status: DC
  Administered 2020-12-14 – 2020-12-19 (×6): 81 mg via ORAL
  Filled 2020-12-13 (×6): qty 1

## 2020-12-13 MED ORDER — DONEPEZIL HCL 5 MG PO TABS
10.0000 mg | ORAL_TABLET | Freq: Every day | ORAL | Status: DC
  Administered 2020-12-13 – 2020-12-18 (×6): 10 mg via ORAL
  Filled 2020-12-13 (×8): qty 2

## 2020-12-13 MED ORDER — ATORVASTATIN CALCIUM 20 MG PO TABS
40.0000 mg | ORAL_TABLET | Freq: Every day | ORAL | Status: DC
  Administered 2020-12-14 – 2020-12-19 (×6): 40 mg via ORAL
  Filled 2020-12-13 (×6): qty 2

## 2020-12-13 MED ORDER — INSULIN ASPART 100 UNIT/ML IJ SOLN
0.0000 [IU] | Freq: Three times a day (TID) | INTRAMUSCULAR | Status: DC
  Administered 2020-12-14: 2 [IU] via SUBCUTANEOUS
  Administered 2020-12-14: 1 [IU] via SUBCUTANEOUS
  Administered 2020-12-15: 3 [IU] via SUBCUTANEOUS
  Administered 2020-12-15: 2 [IU] via SUBCUTANEOUS
  Administered 2020-12-16 – 2020-12-17 (×3): 1 [IU] via SUBCUTANEOUS
  Administered 2020-12-18: 3 [IU] via SUBCUTANEOUS
  Administered 2020-12-18 – 2020-12-19 (×2): 1 [IU] via SUBCUTANEOUS
  Administered 2020-12-19: 7 [IU] via SUBCUTANEOUS
  Filled 2020-12-13 (×10): qty 1

## 2020-12-13 MED ORDER — ACETAMINOPHEN 650 MG RE SUPP: 650.0000 mg | RECTAL | Status: DC | PRN

## 2020-12-13 MED ORDER — PANTOPRAZOLE SODIUM 20 MG PO TBEC
20.0000 mg | DELAYED_RELEASE_TABLET | Freq: Every day | ORAL | Status: DC
  Administered 2020-12-14 – 2020-12-19 (×6): 20 mg via ORAL
  Filled 2020-12-13 (×6): qty 1

## 2020-12-13 NOTE — ED Notes (Signed)
Patient transported to MRI 

## 2020-12-13 NOTE — ED Triage Notes (Signed)
Pt alert and oriented to person only on arrival.

## 2020-12-13 NOTE — Consult Note (Addendum)
Neurology Consultation Reason for Consult: Stroke Requesting Physician: Leandro Reasoner  CC: Generalized weakness   History is obtained from: Son at bedside and chart review  HPI: Paul Martinez is a 87 y.o. 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  Please see my note from 09/22/2020 for full details of his neurological history.  At that time he presented with left arm weakness and drift as well as some difficulty with the left leg.  Examination was notable for full strength throughout except for distal greater than proximal left upper extremity weakness (3/5 and 4/5 respectively (and baseline disorientation.  Reflexes were brisk but 2+ and symmetric throughout.  Etiology was felt to be small vessel versus potentially embolic, with A1c meeting goal at 5.8%, LDL above goal at 107 (started on atorvastatin 40 mg nightly), carotid duplex with mild bilateral stenosis (right ICA less than 50%, left ICA 50 to 69%), MRA with a left dominant vertebral artery with right vertebral artery possibly ending in PICA (poorly visualized) as well as some narrowing in the left M1 MCA and bilateral PCAs in the P2 segments.  Echocardiogram was notable for grade 3 diastolic dysfunction (restrictive pattern) and normal biatrial sizes.  He was treated with dual antiplatelet therapy for planned 21-day course with plan for lifelong aspirin.  Notably loop monitor was placed on 11/30/2020 (ZIO monitor on 2 separate occasions has been negative for atrial fibrillation).  He presented on 12/13/2020 for altered mental status and an unwitnessed fall.  Reportedly on 5/2 he was found on the ground next to the toilet and stated he was unable to pull up his pants leading to mechanical fall, per son he may have put both of his legs through  one of the like holes in his depends.  He was assisted back to the bed and has not ambulated outside of his room since then.  On the morning of 5/3 morning he was noted to be unable to get out of bed and into the chair which he is normally able to do with assistance/with the aid of a walker.  Head CT revealed new hypodensity in the left caudate nucleus possibly extending into the internal capsule for which neurology was consulted.  Son notes that his father is less frequently tearful about his wife's passing since his last stroke, but continues to struggle with oral intake and son feels he is frequently dehydrated.  Transition to palliative care was brought up to him by another physician and he is interested in this possibility  LKW: 5/2 or potentially 5/1 tPA given?: No, due to out of the window  Premorbid modified rankin scale:      3 - Moderate disability. Requires some help, but able to walk unassisted. ROS: Unable to obtain due to baseline dementia, obtained from son as above.   Past Medical History:  Diagnosis Date  . Diabetes mellitus without complication (HCC)   . GERD (gastroesophageal reflux disease)   . Hypertension   . Stroke (HCC)   . Thrombocytopenia (HCC)    History reviewed. No pertinent surgical history.  Current Meds  Medication Sig  . acetaminophen (TYLENOL) 325 MG tablet Take 650 mg by mouth every 6 (six) hours as needed.  Marland Kitchen aspirin EC 81 MG tablet Take 81 mg by mouth daily. Swallow whole.  Marland Kitchen atorvastatin (LIPITOR) 40 MG tablet Take 1 tablet (40 mg  total) by mouth daily.  Marland Kitchen buPROPion (WELLBUTRIN SR) 150 MG 12 hr tablet Take 150 mg by mouth daily.  . clopidogrel (PLAVIX) 75 MG tablet Take 1 tablet (75 mg total) by mouth daily.  Lennox Solders (EUCRISA) 2 % OINT Apply topically 2 (two) times daily as needed.  Marland Kitchen dextromethorphan-guaiFENesin (MUCINEX DM) 30-600 MG 12hr tablet Take 2 tablets by mouth 3 (three) times daily as needed for cough.  . donepezil (ARICEPT) 10 MG  tablet Take 10 mg by mouth at bedtime.  . Doxepin HCl 5 % CREA Apply topically 2 (two) times daily as needed.  . Emollient (CERAVE) CREA Apply topically daily as needed (dry skin).  Marland Kitchen glipiZIDE (GLUCOTROL) 10 MG tablet Take 10 mg by mouth daily.  . iron polysaccharides (NIFEREX) 150 MG capsule Take 150 mg by mouth daily.  Marland Kitchen lisinopril (ZESTRIL) 40 MG tablet Take 40 mg by mouth daily.  . metFORMIN (GLUCOPHAGE) 500 MG tablet Take 500 mg by mouth 2 (two) times daily with a meal.  . Multiple Vitamins-Minerals (PRESERVISION AREDS 2+MULTI VIT PO) Take 1 tablet by mouth 2 (two) times daily.  . pantoprazole (PROTONIX) 20 MG tablet Take 20 mg by mouth daily.  . sertraline (ZOLOFT) 50 MG tablet Take 100 mg by mouth daily.  . vitamin B-12 (CYANOCOBALAMIN) 1000 MCG tablet Take 1,000 mcg by mouth daily.    Family History  Problem Relation Age of Onset  . Stroke Mother   . Stroke Father   . Stroke Brother   . Cerebral aneurysm Brother     Social History:  reports that he has quit smoking. He has never used smokeless tobacco. He reports that he does not drink alcohol and does not use drugs.   Exam: Current vital signs: BP (!) 151/78   Pulse 69   Temp 98.5 F (36.9 C) (Oral)   Resp 15   Ht 5\' 11"  (1.803 m)   Wt 97.9 kg   SpO2 99%   BMI 30.11 kg/m  Vital signs in last 24 hours: Temp:  [98.5 F (36.9 C)] 98.5 F (36.9 C) (05/03 1322) Pulse Rate:  [63-69] 69 (05/03 1830) Resp:  [15-18] 15 (05/03 1830) BP: (123-155)/(58-78) 151/78 (05/03 1830) SpO2:  [92 %-100 %] 99 % (05/03 1830) Weight:  [97.9 kg] 97.9 kg (05/03 1319)   Physical Exam  Constitutional: Appears well-developed and well-nourished.  Psych: Affect appropriate to situation, calm and cooperative with a mildly anxious affect Eyes: No scleral injection HENT: No oropharyngeal obstruction.  MSK: no joint deformities.  Cardiovascular: Normal rate and regular rhythm.  Respiratory: Effort normal, non-labored breathing GI: Soft.   No distension. There is no tenderness.  Skin: Healing scabs on his shins from prior falls  Neuro: Mental Status: Patient is awake, alert, oriented to self Patient is able to give no significant history  No signs of aphasia or neglect though testing limited secondary to hearing aids being currently out of battery Cranial Nerves: II: Visual Fields are full to orienting to stimuli in all directions. Pupils are equal, round, and reactive to light 2 mm to 1.5 mm  III,IV, VI: EOMI without ptosis or diploplia, saccadic pursuits  V: Facial sensation is symmetric to eyelash brush VII: Facial movement is symmetric.  VIII: hearing is extremely hard of hearing at baseline  X: Uvula elevates symmetrically XI: Shoulder shrug is difficult to assess due to dementia  XII: tongue is midline without atrophy or fasciculations.  Motor: Tone is notable for paratonia. Bulk is normal. Confrontational testing  challenging given hearing loss and dementia, but patient's movements are mildly weaker in the right arm and leg, particularly the right leg which is 3/5 at the hip and cannot maintain antigravity for more than a few seconds.  He has very slight drift of the right upper extremity on drift testing as well.  Notably his left upper extremity weakness from my last examination appears to have resolved. Sensory: Equally responsive to touch in all 4 extremities Deep Tendon Reflexes: 3+ and symmetric in the biceps and patellae.  Plantars: Toes are downgoing bilaterally.  Cerebellar: Finger-nose is intact bilaterally, within limits of his weakness in the right upper extremity  NIHSS total 3 Score breakdown: One point for drift of the right upper extremity, 2 points for drift of the right lower extremity    I have reviewed labs in epic and the results pertinent to this consultation are: Creatinine 1.93 (increased from baseline of 1.7 (mild increase in BUN from 19-24, CBC with stable thrombocytopenia (platelets  101), mild normocytic anemia (hemoglobin 12.1), negative respiratory viral panel,  Lab Results  Component Value Date   CHOL 115 12/14/2020   HDL 42 12/14/2020   LDLCALC 55 12/14/2020   TRIG 88 12/14/2020   CHOLHDL 2.7 12/14/2020   Improved from LDL of 107 previously   Lab Results  Component Value Date   HGBA1C 5.8 (H) 12/13/2020   Stable from last admission  I have reviewed the images obtained: Head CT with hypodensity centered in the left caudate head with some edema MRI brain confirming acute infarct in the left anterior basal ganglia with extension into the anterior limb of the internal capsule   Impression: Cryptogenic stroke.  Location in the basal ganglia suspicious for small vessel disease again, the patient does have a significant background of white matter disease likely secondary to chronic microvascular change.  However the size is large enough that embolic so should also be considered.  Given very recent stroke work-up do not think that repeat echo or vessel imaging will add much at this time.  Patient's LDL now meets goal at 55 (goal less than 70) and hemoglobin A1c remains stable and below goal at 5.8 (goal less than 7%)  Recommendations: -Interrogate loop recorder for arrhythmias, appreciate cardiology assistance  -Continue dual antiplatelet therapy, would repeat another 21 day course per neurological indication unless indication for anticoagulation found  -Continue statin, 40 mg atorvastatin only, meeting LDL goal -Consider palliative care consult given family's interest -Neurology will follow up the loop recorder interrogation results otherwise we will be available on an as-needed basis going forward, please page if new questions arise  Brooke Dare MD-PhD [facility] [phone] [facility] coverage for [facility] is from 8 AM to 4 AM in-house and 4 PM to 8 PM by telephone/video. 8 PM to 8 AM emergent questions or overnight urgent questions  should be addressed to Teleneurology On-call or [facility] neurohospitalist; contact information can be found on AMION   Addendum: loop recorder report reviewed. No indication for [facility] based on results. Again, please reach out to neurology if new questions arise, otherwise plan as above

## 2020-12-13 NOTE — ED Notes (Addendum)
Patient is resting comfortably. Call light in reach. Fall precautions in place. Patients son at bedside. Patient awaiting test results.

## 2020-12-13 NOTE — ED Triage Notes (Signed)
Pt BIBA for AMS and an unwitnessed fall. Pt was found on the floor this am, unknown how long he was there. Per EMS pt is not at baseline x couple days. Unknown of pts baseline at this time. Pt also having weakness, pt normally walks with a walker, but unable to stand with EMS. Vitals stable. Hx of dementia.

## 2020-12-13 NOTE — ED Provider Notes (Addendum)
[facility] Emergency Department Provider Note  Time seen: 1:18 PM  I have reviewed the triage vital signs and the nursing notes.   HISTORY  Chief Complaint Fall and Altered Mental Status   HPI Paul Martinez is a 87 y.o. male with a past medical history of diabetes, hypertension, prior CVA, dementia, presents to the emergency department for weakness and possible fall.  According to EMS report patient lives at a nursing facility, staff found him on the floor this morning, they got him back into bed.  However they state today the patient has been more weak than typical, did not eat breakfast or lunch which is atypical.  They state the patient normally can ambulate with a walker (it is not clear if he needs additional assistance or not while using a walker) but today has not been able to get out of bed due to weakness.   Patient has dementia is not able to contribute to his review of systems or current history  Past Medical History:  Diagnosis Date  . Diabetes mellitus without complication (HCC)   . GERD (gastroesophageal reflux disease)   . Hypertension   . Stroke (HCC)   . Thrombocytopenia [facility])     Patient Active Problem List   Diagnosis Date Noted  . Left arm weakness 09/21/2020  . Dementia (HCC) 09/21/2020  . Stroke (HCC) 09/21/2020  . Diabetes mellitus without complication (HCC)   . Hypertension   . GERD (gastroesophageal reflux disease)   . Depression   . Thrombocytopenia (HCC)   . ICH (intracerebral hemorrhage) (HCC) 09/30/2017  . DIZZINESS 02/23/2010  . DYSPNEA 02/23/2010  . ABNORMAL ELECTROCARDIOGRAM 02/23/2010    No past surgical history on file.  Prior to Admission medications   Medication Sig Start Date End Date Taking? Authorizing Provider  acetaminophen (TYLENOL) 325 MG tablet Take 650 mg by mouth every 6 (six) hours as needed.    [provider]  aspirin EC 81 MG tablet Take 81 mg by mouth daily. Swallow whole.    [provider]  atorvastatin (LIPITOR) 40 MG tablet Take 1 tablet (40 mg total) by mouth daily. 09/23/20   Azucena Fallen, MD  buPROPion (WELLBUTRIN XL) 150 MG 24 hr tablet Take 150 mg by mouth every morning. 09/20/20   [provider]  clopidogrel (PLAVIX) 75 MG tablet Take 1 tablet (75 mg total) by mouth daily. 09/23/20   Azucena Fallen, MD  donepezil (ARICEPT) 10 MG tablet Take 10 mg by mouth at bedtime. 09/03/20   [provider]  glipiZIDE (GLUCOTROL) 10 MG tablet Take 10 mg by mouth daily before breakfast. 11/16/19   [provider]  iron polysaccharides (NIFEREX) 150 MG capsule Take 150 mg by mouth daily.    [provider]  lisinopril (ZESTRIL) 20 MG tablet Take 1 tablet by mouth daily. 11/15/20   [provider]  metFORMIN (GLUCOPHAGE) 500 MG tablet Take 500 mg by mouth 2 (two) times daily with a meal.    [provider]  Multiple Vitamins-Minerals (PRESERVISION AREDS 2+MULTI VIT PO) Take 1 tablet by mouth 2 (two) times daily.    [provider]  pantoprazole (PROTONIX) 20 MG tablet Take 20 mg by mouth daily. 08/01/20   [provider]  sertraline (ZOLOFT) 50 MG tablet Take 100 mg by mouth daily.    [provider]  vitamin B-12 (CYANOCOBALAMIN) 1000 MCG tablet Take 1,000 mcg by mouth daily.    [provider]  Allergies  Allergen Reactions  . Penicillins     nka is listed on MAR    Family History  Problem Relation Age of Onset  . Stroke Mother   . Stroke Father   . Stroke Brother   . Cerebral aneurysm Brother     Social History Social History   Tobacco Use  . Smoking status: Former Games developer  . Smokeless tobacco: Never Used  Vaping Use  . Vaping Use: Never used  Substance Use Topics  . Alcohol use: No  . Drug use: No    Review of Systems Patient has baseline dementia.  Unable to obtain adequate/accurate review of  systems. ____________________________________________   PHYSICAL EXAM:  Constitutional: Awake alert, no acute distress.  Answers questions but is confused (dementia baseline) Eyes: Normal exam ENT      Head: Normocephalic and atraumatic.      Mouth/Throat: Mucous membranes are moist. Cardiovascular: Normal rate, regular rhythm. No murmurs, rubs, or gallops. Respiratory: Normal respiratory effort without tachypnea nor retractions. Breath sounds are clear Gastrointestinal: Soft and nontender. No distention.  Musculoskeletal: Nontender with normal range of motion in all extremities. Neurologic:  Normal language.  Patient has equal grip strength bilaterally.  Unable to adequately assess cranial nerves.  Patient is able to lift both legs well. Skin:  Skin is warm, dry and intact.  Psychiatric: Mood and affect are normal.  ____________________________________________    EKG  EKG viewed and interpreted by myself shows a normal sinus rhythm at 67 bpm with a narrow QRS, normal axis, normal intervals, nonspecific but no concerning ST changes.  ____________________________________________    RADIOLOGY  CT scan is concerning for acute infarct.  Recommend MRI. Chest x-ray negative  ____________________________________________   INITIAL IMPRESSION / ASSESSMENT AND PLAN / ED COURSE  Pertinent labs & imaging results that were available during my care of the patient were reviewed by me and considered in my medical decision making (see chart for details).   Patient presents emergency department for weakness and possible fall.  Overall patient appears well, no traumatic findings on examination.  However given the report of weakness we will check labs, urinalysis, COVID swab, chest x-ray.  Given the fall this morning we will obtain a CT scan of the head as a precaution.  Differential would include intracranial hemorrhage, infectious etiologies such as UTI or COVID, metabolic or electrolyte  abnormality.  Patient CT scan is concerning for an acute infarct.  We will obtain an MRI to further evaluate.  Given the patient's weakness which appears new we will admit to the hospitalist service for further work-up and treatment.  Patient has baseline dementia and effort, unable to obtain an accurate or adequate NIH stroke scale.  Son did state patient had a stroke back in February, is unclear if this is acute CVA versus CVA in February.  We will obtain MRI to further evaluate.  Regardless patient is unable to ambulate and at baseline ambulates with a walker very well per son.  Given the acute decline will admit to the hospital service.  Urinalysis pending.  Paul Martinez was evaluated in Emergency Department on 12/13/2020 for the symptoms described in the history of present illness. He was evaluated in the context of the global COVID-19 pandemic, which necessitated consideration that the patient might be at risk for infection with the SARS-CoV-2 virus that causes COVID-19. Institutional protocols and algorithms that pertain to the evaluation of patients at risk for COVID-19 are in a state of rapid change based  on information released by regulatory bodies including the CDC and federal and state organizations. These policies and algorithms were followed during the patient's care in the ED.  ____________________________________________   FINAL CLINICAL IMPRESSION(S) / ED DIAGNOSES  Weakness Fall Acute CVA   Minna Antis, MD 12/13/20 1457    Minna Antis, MD 12/13/20 [phone]

## 2020-12-13 NOTE — ED Triage Notes (Signed)
Patients son at bedside, reports patients baseline mentation is alert and oriented x1. Patient is currently at baseline neurologically per son. Patients son concerned about pateint having 2 falls in the past 2 days and decrease in appetite. MD notified.

## 2020-12-13 NOTE — H&P (Signed)
History and Physical:    Paul Martinez   RCV:[id number] DOB: 12-19-1929 DOA: 12/13/2020  Referring MD/provider: Dr. Lenard Martinez PCP: Paul Reichmann, MD   Patient coming from: Home care assisted living  Chief Complaint: Weakness and fall yesterday.  History is entirely per patient's son Paul Martinez who was at bedside.  Patient unable to participate in meaningful history as he does not remember.  History of Present Illness:   Paul Martinez is an 87 y.o. male with PMH significant for dementia, lives in assisted living, HTN, DM 2, GERD, history of ICH and thrombocytopenia and recent history of stroke in February 2022 was in his usual state of health until yesterday when he was found on the ground next to the toilet.  He had not lost consciousness and at that time he stated he was unable to pull his pants up and fell onto his side when he was trying to do that.  Patient was assisted back to the bed by staff.  It was unclear how long he had been on the floor.  This morning when nurses aide went to check on him, it was noted that patient was unable to get up out of bed and into his chair which she normally is able to do with assistance and with the aid of a walker.  Apparently today he was completely unable to even just stand up.  Patient was sent to the ED where a code stroke was not called given his last known normal was last night.  Patient underwent a CT scan which showed a new stroke in the left caudate nucleus possibly extended into the internal capsule.  ED Course:  The patient was noted to be afebrile and mildly hypertensive.  As noted above CT scan showed a new caudate stroke.  Laboratory data was notable for new acute kidney injury with a creatinine of 1.9, up from 1.7 last week and 1.0 two weeks ago.  Patient was sent for MRI and called in for admission to [facility].  ROS:   ROS   Review of Systems: Patient unable to participate in review of systems.  Past Medical  History:   Past Medical History:  Diagnosis Date  . Diabetes mellitus without complication (HCC)   . GERD (gastroesophageal reflux disease)   . Hypertension   . Stroke (HCC)   . Thrombocytopenia (HCC)     Past Surgical History:   History reviewed. No pertinent surgical history.  Social History:   Social History   Socioeconomic History  . Marital status: Widowed    Spouse name: Not on file  . Number of children: Not on file  . Years of education: Not on file  . Highest education level: Not on file  Occupational History  . Not on file  Tobacco Use  . Smoking status: Former Games developer  . Smokeless tobacco: Never Used  Vaping Use  . Vaping Use: Never used  Substance and Sexual Activity  . Alcohol use: No  . Drug use: No  . Sexual activity: Not on file  Other Topics Concern  . Not on file  Social History Narrative  . Not on file   Social Determinants of Health   Financial Resource Strain: Not on file  Food Insecurity: Not on file  Transportation Needs: Not on file  Physical Activity: Not on file  Stress: Not on file  Social Connections: Not on file  Intimate Partner Violence: Not on file    Allergies   Penicillins  Family history:   Family History  Problem Relation Age of Onset  . Stroke Mother   . Stroke Father   . Stroke Brother   . Cerebral aneurysm Brother     Current Medications:   Prior to Admission medications   Medication Sig Start Date End Date Taking? Authorizing Provider  acetaminophen (TYLENOL) 325 MG tablet Take 650 mg by mouth every 6 (six) hours as needed.   Yes [provider]  aspirin EC 81 MG tablet Take 81 mg by mouth daily. Swallow whole.   Yes [provider]  atorvastatin (LIPITOR) 40 MG tablet Take 1 tablet (40 mg total) by mouth daily. 09/23/20  Yes Azucena Fallen, MD  buPROPion [facility] SR) 150 MG 12 hr tablet Take 150 mg by mouth daily.   Yes [provider]  clopidogrel (PLAVIX) 75 MG tablet  Take 1 tablet (75 mg total) by mouth daily. 09/23/20  Yes Azucena Fallen, MD  Crisaborole (EUCRISA) 2 % OINT Apply topically 2 (two) times daily as needed.   Yes [provider]  dextromethorphan-guaiFENesin (MUCINEX DM) 30-600 MG 12hr tablet Take 2 tablets by mouth 3 (three) times daily as needed for cough.   Yes [provider]  donepezil (ARICEPT) 10 MG tablet Take 10 mg by mouth at bedtime. 09/03/20  Yes [provider]  Doxepin HCl 5 % CREA Apply topically 2 (two) times daily as needed.   Yes [provider]  Emollient (CERAVE) CREA Apply topically daily as needed (dry skin).   Yes [provider]  glipiZIDE (GLUCOTROL) 10 MG tablet Take 10 mg by mouth daily. 11/16/19  Yes [provider]  iron polysaccharides (NIFEREX) 150 MG capsule Take 150 mg by mouth daily.   Yes [provider]  lisinopril (ZESTRIL) 40 MG tablet Take 40 mg by mouth daily. 11/15/20  Yes [provider]  metFORMIN (GLUCOPHAGE) 500 MG tablet Take 500 mg by mouth 2 (two) times daily with a meal.   Yes [provider]  Multiple Vitamins-Minerals (PRESERVISION AREDS 2+MULTI VIT PO) Take 1 tablet by mouth 2 (two) times daily.   Yes [provider]  pantoprazole (PROTONIX) 20 MG tablet Take 20 mg by mouth daily. 08/01/20  Yes [provider]  sertraline (ZOLOFT) 50 MG tablet Take 100 mg by mouth daily.   Yes [provider]  vitamin B-12 (CYANOCOBALAMIN) 1000 MCG tablet Take 1,000 mcg by mouth daily.   Yes [provider]    Physical Exam:   Vitals:   12/13/20 1330 12/13/20 1407 12/13/20 1430 12/13/20 1500  BP: (!) 123/58 131/61 128/62 (!) 143/67  Pulse: 64 65 63 67  Resp: 18 16  15   Temp:      TempSrc:      SpO2: 94% 92% 96% 94%  Weight:      Height:         Physical Exam: Blood pressure (!) 143/67, pulse 67, temperature 98.5 F (36.9 C), temperature source Oral, resp. rate 15, height 5\' 11"  (1.803  m), weight 97.9 kg, SpO2 94 %. Gen: Relatively well-appearing gentleman lying flat in bed in no acute distress.  Patient is able to speak in full sentences although he states he does not remember much. Eyes: sclera anicteric, conjuctiva mildly injected bilaterally CVS: S1-S2, regulary, no gallops Respiratory:  decreased air entry likely secondary to decreased inspiratory effort GI: NABS, soft, NT  LE: No edema. No cyanosis Neuro: Difficult to assess.  Patient is weak left greater than  right.  Patient's son states that that is his baseline. Psych: Patient with significant dementia, is quite anxious.   Data Review:    Labs: Basic Metabolic Panel: Recent Labs  Lab 12/13/20 1325  NA 142  K 4.2  CL 110  CO2 23  GLUCOSE 111*  BUN 24*  CREATININE 1.93*  CALCIUM 9.1   Liver Function Tests: Recent Labs  Lab 12/13/20 1325  AST 31  ALT 21  ALKPHOS 101  BILITOT 1.3*  PROT 6.4*  ALBUMIN 3.5   No results for input(s): LIPASE, AMYLASE in the last 168 hours. No results for input(s): AMMONIA in the last 168 hours. CBC: Recent Labs  Lab 12/13/20 1325  WBC 8.7  HGB 12.1*  HCT 36.4*  MCV 95.0  PLT 101*   Cardiac Enzymes: No results for input(s): CKTOTAL, CKMB, CKMBINDEX, TROPONINI in the last 168 hours.  BNP (last 3 results) No results for input(s): PROBNP in the last 8760 hours. CBG: Recent Labs  Lab 12/13/20 1320  GLUCAP 104*    Urinalysis    Component Value Date/Time   COLORURINE YELLOW (A) 10/04/2020 1837   APPEARANCEUR CLEAR (A) 10/04/2020 1837   APPEARANCEUR Clear 09/06/2013 0739   LABSPEC 1.018 10/04/2020 1837   LABSPEC 1.020 09/06/2013 0739   PHURINE 5.0 10/04/2020 1837   GLUCOSEU NEGATIVE 10/04/2020 1837   GLUCOSEU Negative 09/06/2013 0739   HGBUR SMALL (A) 10/04/2020 1837   BILIRUBINUR NEGATIVE 10/04/2020 1837   BILIRUBINUR Negative 09/06/2013 0739   KETONESUR NEGATIVE 10/04/2020 1837   PROTEINUR NEGATIVE 10/04/2020 1837   NITRITE NEGATIVE  10/04/2020 1837   LEUKOCYTESUR SMALL (A) 10/04/2020 1837   LEUKOCYTESUR Negative 09/06/2013 0739      Radiographic Studies: CT Head Wo Contrast  Result Date: 12/13/2020 CLINICAL DATA:  Unwitnessed fall. Found on floor this morning. Mental status change. EXAM: CT HEAD WITHOUT CONTRAST TECHNIQUE: Contiguous axial images were obtained from the base of the skull through the vertex without intravenous contrast. COMPARISON:  10/04/2020 FINDINGS: Brain: There is hypoattenuation involving a significant portion of the left caudate nucleus head, new since the prior CT. This is consistent with a recent infarct. No other evidence of a recent infarct. Small area old infarction involving the left occipital lobe, with another area involving the left cerebellum, both stable. Age related ventricular and sulcal enlargement. No hydrocephalus. Bilateral patchy white hypoattenuation is noted consistent with advanced chronic microvascular ischemic change, stable. There are no parenchymal masses or mass effect. No extra-axial masses or abnormal fluid collections. No intracranial hemorrhage. Vascular: No hyperdense vessel or unexpected calcification. Skull: Normal. Negative for fracture or focal lesion. Sinuses/Orbits: Globes and orbits are unremarkable. Visualized sinuses are clear. Other: None. IMPRESSION: 1. Infarct involving a significant portion of the left caudate nucleus head, possibly extending to the genu and anterior limb of the left internal capsule. This is new since the prior head CT and is suspected to be recent. Further assessment, to establish chronicity, with unenhanced brain MRI is suggested. 2. No other evidence of a recent infarct. 3. No intracranial hemorrhage. 4. Chronic changes of advanced chronic microvascular ischemic change as well as old left occipital and left cerebellar infarcts. Electronically Signed   By: Amie Portlandavid  Ormond M.D.   On: 12/13/2020 14:32   DG Chest Portable 1 View  Result Date:  12/13/2020 CLINICAL DATA:  Fall today.  Weakness.  Altered mental status. EXAM: PORTABLE CHEST 1 VIEW COMPARISON:  09/06/2013 FINDINGS: Cardiac silhouette is top-normal in size. No mediastinal or hilar masses. Clear lungs.  No convincing pleural effusion.  No pneumothorax. Skeletal structures are grossly intact. IMPRESSION: No active disease. Electronically Signed   By: Amie Portland M.D.   On: 12/13/2020 14:25    EKG: Ordered and Pending    Assessment/Plan:   Principal Problem:   Stroke [facility]) Active Problems:   Diabetes mellitus without complication (HCC)   Hypertension   Dementia (HCC)  87 yo with dementia is admitted for weakness, w/u reveals new caudate-->IC stroke.    CVA Patient with new left caudate CVA with possible extension into the internal capsule was recently discharged 3 months ago with right MCA stroke. Patient was placed on aspirin, Plavix and atorvastatin in February 2022 Will continue aspirin, Plavix and atorvastatin overnight. Neuro consultation placed for change in antiplatelet agents if warranted per their recommendations. Patient admitted per stroke protocol with neurochecks every 4 hours, permissive hypertension. MRI ordered in the ED Will not order carotid Dopplers or echocardiogram as they were just done in February. PT, OT and speech therapy requested  AKI Patient had developed acute kidney injury least as far back as last week. CPK was not done, will order since we do not know how long patient was on the floor in the bathroom yesterday, would want a rule out rhabdomyolysis. In the meanwhile we will hold metformin and lisinopril while we hydrate gently  Goals for care Discussed with patient's son who notes that although his father is DNR they would like a complete work-up at present. They understand issues surrounding palliative care and will make that decision in the future if warranted. At present they do not want a palliative care  consultation  DM2 Hold glipizide and metformin SSI AC at bedtime ordered with carb controlled diet  HTN Hold lisinopril overnight for permissive hypertension  Dementia Continue donepezil, sertraline and bupropion  Other information:   DVT prophylaxis: SCD ordered. Code Status: DNR Family Communication: Patient's son Paul Martinez was present throughout Disposition Plan: Home care, may need to be placed in SNF although presently in assisted living Consults called: Neurology Admission status: Inpatient  Horatio Pel Tublu An[place]a Satre [facility]  If 7PM-7AM, please contact night-coverage [email or web address]

## 2020-12-13 NOTE — ED Notes (Signed)
Patient transported to CT 

## 2020-12-14 ENCOUNTER — Encounter: Payer: Self-pay | Admitting: Cardiology

## 2020-12-14 DIAGNOSIS — E119 Type 2 diabetes mellitus without complications: Secondary | ICD-10-CM | POA: Diagnosis not present

## 2020-12-14 DIAGNOSIS — F0150 Vascular dementia without behavioral disturbance: Secondary | ICD-10-CM | POA: Diagnosis not present

## 2020-12-14 DIAGNOSIS — I10 Essential (primary) hypertension: Secondary | ICD-10-CM | POA: Diagnosis not present

## 2020-12-14 LAB — GLUCOSE, CAPILLARY
Glucose-Capillary: 97 mg/dL (ref 70–99)
Glucose-Capillary: 126 mg/dL — ABNORMAL HIGH (ref 70–99)
Glucose-Capillary: 154 mg/dL — ABNORMAL HIGH (ref 70–99)
Glucose-Capillary: 105 mg/dL — ABNORMAL HIGH (ref 70–99)

## 2020-12-14 LAB — HEMOGLOBIN A1C
Hgb A1c MFr Bld: 5.7 % — ABNORMAL HIGH (ref 4.8–5.6)
Mean Plasma Glucose: 116.89 mg/dL

## 2020-12-14 LAB — LIPID PANEL
Cholesterol: 115 mg/dL (ref 0–200)
HDL: 42 mg/dL (ref >40)
LDL Cholesterol: 55 mg/dL (ref 0–99)
Total CHOL/HDL Ratio: 2.7 RATIO
Triglycerides: 88 mg/dL (ref <150)
VLDL: 18 mg/dL (ref 0–40)

## 2020-12-14 LAB — CK
Total CK: 171 U/L (ref 49–397)
Total CK: 566 U/L — ABNORMAL HIGH (ref 49–397)

## 2020-12-14 MED ORDER — ADULT MULTIVITAMIN W/MINERALS CH
1.0000 | ORAL_TABLET | Freq: Every day | ORAL | Status: DC
  Administered 2020-12-15 – 2020-12-19 (×5): 1 via ORAL
  Filled 2020-12-14 (×5): qty 1

## 2020-12-14 MED ORDER — ENSURE ENLIVE PO LIQD
237.0000 mL | Freq: Three times a day (TID) | ORAL | Status: DC
  Administered 2020-12-14 – 2020-12-19 (×12): 237 mL via ORAL

## 2020-12-14 NOTE — Progress Notes (Signed)
[facility]                                                                              Patient Demographics  Paul Martinez, is a 87 y.o. male, DOB - 07-31-1930, WUJ:[id number]  Admit date - 12/13/2020   Admitting Physician Pieter Partridge, MD  Outpatient Primary MD for the patient is Barbette Reichmann, MD  Outpatient specialists:   LOS - 1  days   Medical records reviewed and are as summarized below:    Chief Complaint  Patient presents with  . Fall  . Altered Mental Status       Brief summary   Patient is a 87 year old male with history of dementia, lives in ALF, hypertension, diabetes mellitus type 2, GERD, history of ICH, thrombocytopenia and recent history of stroke in February 2022, was in his usual state of health until yesterday when he was found on the ground next to the toilet.  He had not lost consciousness and at that time he stated he was unable to pull his pants up and fell onto his side when he was trying to do that.  Patient was assisted back to the bed by staff.  It was unclear how long he had been on the floor.    On the morning of admission, when nurses aide went to check on him, patient was unable to get up out of bed and into his chair.  He was unable to even just stand up. Patient was brought to ED, underwent CT scan which showed new stroke in the left caudate nucleus possibly extended into the internal capsule. Patient was admitted due to acute CVA  Assessment & Plan    Principal Problem: Acute left caudate nucleus CVA -In the setting of recent right MCA CVA in 09/2020.  Patient was placed on aspirin, Plavix and atorvastatin in 09/2020. -Neuro consulted.  MRI brain showed acute infarct left anterior basal ganglia, atrophy and extensive chronic ischemic changes -Recent stroke work have been completed, including echo in 09/2020 showed EF of 60 to 65% grade 3 DD -PT evaluation recommended SNF, TOC consult placed -LDL 55, continue  statin -Neurology recommended interrogation of loop recorder.  Per cardiology, no A. fib noted -Continue dual antiplatelet agents for 21 days -Discussed in detail with patient's son at bedside, requested palliative care consult -SLP evaluation recommended dysphagia 3 diet with thin liquids  Active Problems: Acute kidney injury  -Creatinine 1.7 in 2/22, presented with creatinine of 1.93 on admission -Follow-up BMET, hold lisinopril, metformin    Diabetes mellitus type II, not on long-term insulin, with hyperlipidemia, stroke -Hold metformin, glipizide -Continue sliding scale insulin while inpatient    Hypertension -Currently stable, hold lisinopril    Dementia (HCC) Continue Zoloft, Wellbutrin, Aricept   Obesity  Estimated body mass index is 30.11 kg/m as calculated from the following:   Height as of this encounter:  (1.803 m).   Weight as of this encounter: 97.9 kg.  Code Status DNR DVT Prophylaxis:  SCD's Start: 12/13/20 1654   Level of Care: Level of care: Progressive Cardiac Family Communication: Discussed all imaging results, lab results, explained to  the patient and son at the bedside   Disposition Plan:     Status is: Inpatient  Remains inpatient appropriate because:Inpatient level of care appropriate due to severity of illness   Dispo: The patient is from: ALF              Anticipated d/c is to: SNF              Patient currently is not medically stable to d/c. acute CVA, needs SNF, palliative medicine consult pending    Difficult to place patient No      Time Spent in minutes 35 minutes  Procedures:  MRI brain Loop recorder interrogation  Consultants:   Neurology Palliative medicine  Antimicrobials:   Anti-infectives (From admission, onward)   None          Medications  Scheduled Meds: . aspirin EC  81 mg Oral Daily  . atorvastatin  40 mg Oral Daily  . buPROPion  150 mg Oral Daily  . clopidogrel  75 mg Oral Daily  . donepezil   10 mg Oral QHS  . feeding supplement  237 mL Oral TID BM  . insulin aspart  0-9 Units Subcutaneous TID WC  . [START ON 12/15/2020] multivitamin with minerals  1 tablet Oral Daily  . pantoprazole  20 mg Oral Daily  . sertraline  100 mg Oral Daily   Continuous Infusions: PRN Meds:.acetaminophen **OR** acetaminophen (TYLENOL) oral liquid 160 mg/5 mL **OR** acetaminophen      Subjective:   Paul Martinez was seen and examined today.  Confused, oriented to himself, close to his baseline per patient's son at the bedside.  Patient has dementia.  Difficult to obtain review of system from patient.  No acute events overnight.  Objective:   Vitals:   12/14/20 0500 12/14/20 0826 12/14/20 1153 12/14/20 1429  BP: (!) 151/74 (!) 142/71 132/69 132/61  Pulse: 62 61 61 64  Resp: 12 16 18 16   Temp: 97.8 F (36.6 C) 98 F (36.7 C) 97.7 F (36.5 C) 97.8 F (36.6 C)  TempSrc: Oral Oral Oral Oral  SpO2: 96% 95% 97% 97%  Weight:      Height:        Intake/Output Summary (Last 24 hours) at 12/14/2020 1553 Last data filed at 12/14/2020 1500 Gross per 24 hour  Intake 1340 ml  Output --  Net 1340 ml     Wt Readings from Last 3 Encounters:  12/13/20 97.9 kg  11/30/20 90.7 kg  11/07/20 98.4 kg     Exam  General: Alert and oriented x self, NAD  Cardiovascular: S1 S2 auscultated, no murmurs, RRR  Respiratory: Clear to auscultation bilaterally, no wheezing, rales or rhonchi  Gastrointestinal: Soft, nontender, nondistended, + bowel sounds  Ext: no pedal edema bilaterally  Neuro: difficult to assess, left LE slightly weaker, UE b/l 5/5   Musculoskeletal: No digital cyanosis, clubbing  Skin: No rashes  Psych: dementia    Data Reviewed:  I have personally reviewed following labs and imaging studies  Micro Results Recent Results (from the past 240 hour(s))  Resp Panel by RT-PCR (Flu A&B, Covid) Nasopharyngeal Swab     Status: None   Collection Time: 12/13/20  1:25 PM   Specimen:  Nasopharyngeal Swab; Nasopharyngeal(NP) swabs in vial transport medium  Result Value Ref Range Status   SARS Coronavirus 2 by RT PCR NEGATIVE NEGATIVE Final    Comment: (NOTE) SARS-CoV-2 target nucleic acids are NOT DETECTED.  The SARS-CoV-2 RNA is generally detectable in  upper respiratory specimens during the acute phase of infection. The lowest concentration of SARS-CoV-2 viral copies this assay can detect is 138 copies/mL. A negative result does not preclude SARS-Cov-2 infection and should not be used as the sole basis for treatment or other patient management decisions. A negative result may occur with  improper specimen collection/handling, submission of specimen other than nasopharyngeal swab, presence of viral mutation(s) within the areas targeted by this assay, and inadequate number of viral copies(<138 copies/mL). A negative result must be combined with clinical observations, patient history, and epidemiological information. The expected result is Negative.  Fact Sheet for Patients:  BloggerCourse.com  Fact Sheet for Healthcare Providers:  SeriousBroker.it  This test is no t yet approved or cleared by the [place] FDA and  has been authorized for detection and/or diagnosis of SARS-CoV-2 by FDA under an Emergency Use Authorization (EUA). This EUA will remain  in effect (meaning this test can be used) for the duration of the COVID-19 declaration under Section 564(b)(1) of the Act, 21 U.S.C.section 360bbb-3(b)(1), unless the authorization is terminated  or revoked sooner.       Influenza A by PCR NEGATIVE NEGATIVE Final   Influenza B by PCR NEGATIVE NEGATIVE Final    Comment: (NOTE) The Xpert Xpress SARS-CoV-2/FLU/RSV plus assay is intended as an aid in the diagnosis of influenza from Nasopharyngeal swab specimens and should not be used as a sole basis for treatment. Nasal washings and aspirates are unacceptable for  Xpert Xpress SARS-CoV-2/FLU/RSV testing.  Fact Sheet for Patients: BloggerCourse.com  Fact Sheet for Healthcare Providers: SeriousBroker.it  This test is not yet approved or cleared by the [place] FDA and has been authorized for detection and/or diagnosis of SARS-CoV-2 by FDA under an Emergency Use Authorization (EUA). This EUA will remain in effect (meaning this test can be used) for the duration of the COVID-19 declaration under Section 564(b)(1) of the Act, 21 U.S.C. section 360bbb-3(b)(1), unless the authorization is terminated or revoked.  Performed at [facility], [address], [place], [place] [address]     Radiology Reports CT Head Wo Contrast  Result Date: 12/13/2020 CLINICAL DATA:  Unwitnessed fall. Found on floor this morning. Mental status change. EXAM: CT HEAD WITHOUT CONTRAST TECHNIQUE: Contiguous axial images were obtained from the base of the skull through the vertex without intravenous contrast. COMPARISON:  10/04/2020 FINDINGS: Brain: There is hypoattenuation involving a significant portion of the left caudate nucleus head, new since the prior CT. This is consistent with a recent infarct. No other evidence of a recent infarct. Small area old infarction involving the left occipital lobe, with another area involving the left cerebellum, both stable. Age related ventricular and sulcal enlargement. No hydrocephalus. Bilateral patchy white hypoattenuation is noted consistent with advanced chronic microvascular ischemic change, stable. There are no parenchymal masses or mass effect. No extra-axial masses or abnormal fluid collections. No intracranial hemorrhage. Vascular: No hyperdense vessel or unexpected calcification. Skull: Normal. Negative for fracture or focal lesion. Sinuses/Orbits: Globes and orbits are unremarkable. Visualized sinuses are clear. Other: None. IMPRESSION: 1. Infarct involving a significant  portion of the left caudate nucleus head, possibly extending to the genu and anterior limb of the left internal capsule. This is new since the prior head CT and is suspected to be recent. Further assessment, to establish chronicity, with unenhanced brain MRI is suggested. 2. No other evidence of a recent infarct. 3. No intracranial hemorrhage. 4. Chronic changes of advanced chronic microvascular ischemic change as well as  old left occipital and left cerebellar infarcts. Electronically Signed   By: Amie Portland M.D.   On: 12/13/2020 14:32   MR BRAIN WO CONTRAST  Result Date: 12/13/2020 CLINICAL DATA:  Acute neuro deficit.  Stroke. EXAM: MRI HEAD WITHOUT CONTRAST TECHNIQUE: Multiplanar, multiecho pulse sequences of the brain and surrounding structures were obtained without intravenous contrast. COMPARISON:  CT head 12/13/2020.  MRI head 09/21/2020 FINDINGS: Brain: Acute infarct in the left basal ganglia involving the head of caudate, anterior limb internal capsule, and anterior putamen. No other acute infarct. Resolving small acute infarct in the right parietal lobe which shows restricted diffusion and February 2022. Moderate atrophy. Extensive chronic microvascular ischemic change throughout the white matter bilaterally. Chronic infarcts in the basal ganglia bilaterally, chronic infarct in the right midbrain, and chronic infarcts in the cerebellum bilaterally. Chronic infarct occipital pole bilaterally left greater than right. Negative for hemorrhage or mass. Vascular: Abnormal flow void in the distal right vertebral artery which may be occluded or have slow flow. This is unchanged. Otherwise normal arterial flow voids. Skull and upper cervical spine: Negative Sinuses/Orbits: Paranasal sinuses clear. Bilateral cataract extraction Other: None IMPRESSION: Acute infarct left anterior basal ganglia. Atrophy and extensive chronic ischemic change. Electronically Signed   By: Marlan Palau M.D.   On: 12/13/2020 16:21    DG Chest Portable 1 View  Result Date: 12/13/2020 CLINICAL DATA:  Fall today.  Weakness.  Altered mental status. EXAM: PORTABLE CHEST 1 VIEW COMPARISON:  09/06/2013 FINDINGS: Cardiac silhouette is top-normal in size. No mediastinal or hilar masses. Clear lungs.  No convincing pleural effusion.  No pneumothorax. Skeletal structures are grossly intact. IMPRESSION: No active disease. Electronically Signed   By: Amie Portland M.D.   On: 12/13/2020 14:25    Lab Data:  CBC: Recent Labs  Lab 12/13/20 1325  WBC 8.7  HGB 12.1*  HCT 36.4*  MCV 95.0  PLT 101*   Basic Metabolic Panel: Recent Labs  Lab 12/13/20 1325  NA 142  K 4.2  CL 110  CO2 23  GLUCOSE 111*  BUN 24*  CREATININE 1.93*  CALCIUM 9.1   GFR: Estimated Creatinine Clearance: 30.9 mL/min (A) (by C-G formula based on SCr of 1.93 mg/dL (H)). Liver Function Tests: Recent Labs  Lab 12/13/20 1325  AST 31  ALT 21  ALKPHOS 101  BILITOT 1.3*  PROT 6.4*  ALBUMIN 3.5   No results for input(s): LIPASE, AMYLASE in the last 168 hours. No results for input(s): AMMONIA in the last 168 hours. Coagulation Profile: No results for input(s): INR, PROTIME in the last 168 hours. Cardiac Enzymes: Recent Labs  Lab 12/13/20 1714 12/14/20 0359  CKTOTAL 67 171   BNP (last 3 results) No results for input(s): PROBNP in the last 8760 hours. HbA1C: Recent Labs    12/13/20 1714 12/14/20 0359  HGBA1C 5.8* 5.7*   CBG: Recent Labs  Lab 12/13/20 1320 12/13/20 1711 12/13/20 2106 12/14/20 0825 12/14/20 1156  GLUCAP 104* 71 147* 97 126*   Lipid Profile: Recent Labs    12/14/20 0359  CHOL 115  HDL 42  LDLCALC 55  TRIG 88  CHOLHDL 2.7   Thyroid Function Tests: No results for input(s): TSH, T4TOTAL, FREET4, T3FREE, THYROIDAB in the last 72 hours. Anemia Panel: No results for input(s): VITAMINB12, FOLATE, FERRITIN, TIBC, IRON, RETICCTPCT in the last 72 hours. Urine analysis:    Component Value Date/Time   COLORURINE  YELLOW (A) 10/04/2020 1837   APPEARANCEUR CLEAR (A) 10/04/2020 1837  APPEARANCEUR Clear 09/06/2013 0739   LABSPEC 1.018 10/04/2020 1837   LABSPEC 1.020 09/06/2013 0739   PHURINE 5.0 10/04/2020 1837   GLUCOSEU NEGATIVE 10/04/2020 1837   GLUCOSEU Negative 09/06/2013 0739   HGBUR SMALL (A) 10/04/2020 1837   BILIRUBINUR NEGATIVE 10/04/2020 1837   BILIRUBINUR Negative 09/06/2013 0739   KETONESUR NEGATIVE 10/04/2020 1837   PROTEINUR NEGATIVE 10/04/2020 1837   NITRITE NEGATIVE 10/04/2020 1837   LEUKOCYTESUR SMALL (A) 10/04/2020 1837   LEUKOCYTESUR Negative 09/06/2013 0739     Sultana Tierney M.D. [facility] 12/14/2020, 3:53 PM  Available via Epic secure chat 7am-7pm After 7 pm, please refer to night coverage provider listed on amion.

## 2020-12-14 NOTE — Progress Notes (Signed)
Initial Nutrition Assessment  DOCUMENTATION CODES:   Not applicable  INTERVENTION:   Ensure Enlive po BID, each supplement provides 350 kcal and 20 grams of protein  Magic cup TID with meals, each supplement provides 290 kcal and 9 grams of protein  MVI po daily   Dysphagia 3 diet   NUTRITION DIAGNOSIS:   Increased nutrient needs related to acute illness as evidenced by estimated needs.  GOAL:   Patient will meet greater than or equal to 90% of their needs  MONITOR:   PO intake,Supplement acceptance,Labs,Weight trends,I & O's,Skin  REASON FOR ASSESSMENT:   Malnutrition Screening Tool    ASSESSMENT:   87 y.o. male with PMH significant for dementia, lives in assisted living, HTN, DM 2, GERD, history of ICH and thrombocytopenia and recent history of stroke in February 2022 who is admitted for new CVA   Met with pt and pt's son in room today. Pt is a poor historian related to dementia so majority of history was obtained from pt's son at bedside. Pt is able to report today that he eats well at baseline and that he ate well at breakfast today. Pt is documented to have eaten 100% of his breakfast in chart. Son reports that patient eats well and enjoys the food at the facility where he lives. Pt does not drink any supplements at baseline; son reports that they have tried numerous supplements but reports that patient just does not drink a lot. Per son report, pt requires a mechanical soft diet at baseline and may require assistance with eating. RD will add supplements and MVI to help pt meet his estimated needs. RD will also change pt to a mechanical soft diet per son's request. Per chart, pt appears weight stable at baseline. Palliative care consult pending.   Medications reviewed and include: aspirin, plavix, insulin, MVI, protonix  Labs reviewed: BUN 24(H), creat 1.93(H)  NUTRITION - FOCUSED PHYSICAL EXAM:  Flowsheet Row Most Recent Value  Orbital Region No depletion  Upper  Arm Region No depletion  Thoracic and Lumbar Region No depletion  Buccal Region No depletion  Temple Region Mild depletion  Clavicle Bone Region Mild depletion  Clavicle and Acromion Bone Region Mild depletion  Scapular Bone Region No depletion  Dorsal Hand No depletion  Patellar Region Mild depletion  Anterior Thigh Region No depletion  Posterior Calf Region No depletion  Edema (RD Assessment) None  Hair Reviewed  Eyes Reviewed  Mouth Reviewed  Skin Reviewed  Nails Reviewed     Diet Order:   Diet Order            DIET DYS 3 Room service appropriate? Yes; Fluid consistency: Thin  Diet effective now                EDUCATION NEEDS:   No education needs have been identified at this time  Skin:  Skin Assessment: Reviewed RN Assessment (ecchymosis)  Last BM:  5/4- type 6  Height:   Ht Readings from Last 1 Encounters:  12/13/20 _0  (1.803 m)    Weight:   Wt Readings from Last 1 Encounters:  12/13/20 97.9 kg    Ideal Body Weight:  78 kg  BMI:  Body mass index is 30.11 kg/m.  Estimated Nutritional Needs:   Kcal:  2000-2300kcal/day  Protein:  100-115g/day  Fluid:  2.0-2.3L/day  Koleen Distance MS, RD, LDN Please refer to [facility] for RD and/or RD on-call/weekend/after hours pager

## 2020-12-14 NOTE — Evaluation (Signed)
Clinical/Bedside Swallow Evaluation Patient Details  Name: Paul Martinez MRN: [id number] Date of Birth: 03-19-1930  Today's Date: 12/14/2020 Time: SLP Start Time (ACUTE ONLY): 0945 SLP Stop Time (ACUTE ONLY): 1030 SLP Time Calculation (min) (ACUTE ONLY): 45 min  Past Medical History:  Past Medical History:  Diagnosis Date  . Diabetes mellitus without complication (HCC)   . GERD (gastroesophageal reflux disease)   . Hypertension   . Stroke (HCC)   . Thrombocytopenia (HCC)    Past Surgical History: History reviewed. No pertinent surgical history. HPI:  Pt is an 87 y.o. male with PMH significant for Dementia, lives in assisted living, HTN, DM 2, GERD, history of ICH and thrombocytopenia and recent history of stroke in February 2022 w/ Left sided weakness and requiring assistance w/ ADLs was in his usual state of health until yesterday when he was found on the ground next to the toilet. Patient with new left caudate CVA with possible extension into the internal capsule was recently discharged 3 months ago with right MCA stroke; also - Chronic changes of advanced chronic microvascular ischemic change. Lungs clear per CXR.   Assessment / Plan / Recommendation Clinical Impression  Pt appears to present w/ grossly adequate oropharyngeal phase swallow function w/ No significant, overt oropharyngeal phase dysphagia noted, No neuromuscular deficits noted. Pt consumed po trials w/ No immediate, overt clinical s/s of aspiration during po trials. Pt appears at reduced risk for aspiration following general aspiration precautions and when given Feeding Support during the meal. Pt has declined Cognitive status at Baseline; Dementia. This can impact his overall awareness/engagement and safety during po tasks which increases risk for aspiration, choking. Pt was verbal and responded to cues/followed instructions during the session/meal but required MOD verbal cues for follow through w/ tasks. During po trials, pt  consumed all consistencies w/ no overt coughing, decline in vocal quality, or change in respiratory presentation during/post trials. Oral phase appeared grossly [facility] w/ bolus management, mastication, and control of bolus propulsion for A-P transfer for swallowing. Oral clearing achieved w/ all trial consistencies given time w/ solids. OM Exam appeared [facility] w/ no overt unilateral weakness noted. Speech Clear w/ general verbal responses given. Pt was able to Hold Cup during drinking but did not engage to feed self -- suspect related to Cognitive decline. Recommend a more Mech Soft consistency diet w/ well-Cut meats, moistened foods; Thin liquids -- monitor any straw use. Recommend general aspiration and GERD precautions, Pills Whole vs Crushed in Puree as needed for safer, easier swallowing d/t Cognitive decline. Education given on Pills in Puree; food consistencies and easy to eat options; general aspiration precautions. NSG to reconsult if any new needs arise. NSG agreed. Precautions posted in room. SLP Visit Diagnosis: Dysphagia, unspecified (R13.10) (Cognitive decline baseline - Dementia)    Aspiration Risk  Mild aspiration risk;Risk for inadequate nutrition/hydration (but reduced following general precautions)    Diet Recommendation   Mech Soft consistency diet w/ well-Cut meats, moistened foods; Thin liquids -- monitor any straw use. Recommend general aspiration and GERD precautions. Feeding Support/Assistance at meals - more finger foods for self-feeding. (pt can Hold Cup to Drink)   Medication Administration: Whole meds with puree (vs need to Crush d/t Cognitive decline)    Other  Recommendations Recommended Consults:  (Dietician f/u) Oral Care Recommendations: Oral care BID;Staff/trained caregiver to provide oral care Other Recommendations:  (n/a)   Follow up Recommendations None      Frequency and Duration  (n/a)   (n/a)  Prognosis Prognosis for Safe Diet Advancement: Fair  (-Good) Barriers to Reach Goals: Cognitive deficits;Language deficits;Time post onset;Severity of deficits (baseline)      Swallow Study   General Date of Onset: 12/13/20 HPI: Pt is an 87 y.o. male with PMH significant for Dementia, lives in assisted living, HTN, DM 2, GERD, history of ICH and thrombocytopenia and recent history of stroke in February 2022 w/ Left sided weakness and requiring assistance w/ ADLs was in his usual state of health until yesterday when he was found on the ground next to the toilet. Patient with new left caudate CVA with possible extension into the internal capsule was recently discharged 3 months ago with right MCA stroke; also - Chronic changes of advanced chronic microvascular ischemic change. Lungs clear per CXR. Type of Study: Bedside Swallow Evaluation Previous Swallow Assessment: 09/2020 Diet Prior to this Study: Regular;Thin liquids Temperature Spikes Noted: No (wbc 8.7) Respiratory Status: Room air History of Recent Intubation: No Behavior/Cognition: Alert;Cooperative;Pleasant mood;Confused;Distractible;Requires cueing Oral Cavity Assessment: Dry Oral Care Completed by SLP: Yes Oral Cavity - Dentition: Adequate natural dentition Vision: Functional for self-feeding (held cup when drinking; less engagement w/ self-feeding) Self-Feeding Abilities: Needs assist;Needs set up (Holds cup to drink) Patient Positioning: Upright in bed (needed full positioning Upright) Baseline Vocal Quality: Normal Volitional Cough: Strong Volitional Swallow: Able to elicit    Oral/Motor/Sensory Function Overall Oral Motor/Sensory Function: Within functional limits   Ice Chips Ice chips: Within functional limits Presentation: Spoon (fed; 2 trials)   Thin Liquid Thin Liquid: Within functional limits Presentation: Cup;Self Fed;Straw (supported Cup; ~6 ozs total)    Nectar Thick Nectar Thick Liquid: Not tested   Honey Thick Honey Thick Liquid: Not tested   Puree Puree: Within  functional limits Presentation: Spoon (fed; 4 ozs)   Solid     Solid: Impaired (slight-min) Presentation: Spoon (fed; ~4-6 ozs) Oral Phase Functional Implications: Impaired mastication;Prolonged oral transit (min) Pharyngeal Phase Impairments:  (none) Other Comments: pancake        Jerilynn Som, MS, CCC-SLP Speech Language Pathologist Rehab Services [phone] [facility] 12/14/2020,4:04 PM

## 2020-12-14 NOTE — Evaluation (Signed)
Physical Therapy Evaluation Patient Details Name: Paul Martinez MRN: [id number] DOB: 1929/09/20 Today's Date: 12/14/2020   History of Present Illness  Paul Martinez is an 87 y.o. male with PMH significant for dementia, lives in assisted living, HTN, DM 2, GERD, history of ICH and thrombocytopenia and recent history of stroke in February 2022 was in his usual state of health until yesterday when he was found on the ground next to the toilet. Patient with new left caudate CVA with possible extension into the internal capsule was recently discharged 3 months ago with right MCA stroke  Clinical Impression  Pt is a pleasant 87 year old male who was admitted for CVA in L basal ganglia. Pt performs bed mobility with mod assist, transfers with max assist +2 and unable to ambulate at this time. Pt is confused, however at baseline level. Sensation/coordination appears  Grossly intact with finger->nose. Pt presents with quick fatigue with mobility efforts. Pt demonstrates deficits with confusion/strength/mobility. Son is hopeful to see some improvement and return to function. Pt very pleasant and agreeable to therapy. Would benefit from skilled PT to address above deficits and promote optimal return to PLOF; recommend transition to STR upon discharge from acute hospitalization.     Follow Up Recommendations SNF    Equipment Recommendations  None recommended by PT    Recommendations for Other Services       Precautions / Restrictions Precautions Precautions: Fall Restrictions Weight Bearing Restrictions: No      Mobility  Bed Mobility Overal bed mobility: Needs Assistance Bed Mobility: Supine to Sit;Sit to Supine;Rolling Rolling: Max assist;+2 for physical assistance   Supine to sit: Mod assist Sit to supine: Max assist;+2 for physical assistance   General bed mobility comments: needs cues to initiate sliding B LEs off bed. Mod assist for trunk mobility. Once seated at EOB, mod post  leaning, needs constant min/mod assist to maintain balance. Upon return supine, needs +2 assist for repositioning    Transfers Overall transfer level: Needs assistance Equipment used: Rolling walker (2 wheeled) Transfers: Sit to/from Stand Sit to Stand: Max assist;+2 physical assistance;From elevated surface         General transfer comment: 2 attempts for standing with ability to perform upright posture, however quick fatigue and uncontrolled decent back to bed. Unable to further progress transfers/ambulation  Ambulation/Gait             General Gait Details: unable  Stairs            Wheelchair Mobility    Modified Rankin (Stroke Patients Only)       Balance Overall balance assessment: Needs assistance Sitting-balance support: Single extremity supported;Feet supported Sitting balance-Leahy Scale: Fair Sitting balance - Comments: min assist to maintain seated balance Postural control: Posterior lean Standing balance support: Bilateral upper extremity supported Standing balance-Leahy Scale: Fair                               Pertinent Vitals/Pain Pain Assessment: No/denies pain    Home Living Family/patient expects to be discharged to:: Assisted living ([facility])               Home Equipment: Dan Humphreys - 2 wheels Additional Comments: Son at bedside provided history. Pt resident at [facility].    Prior Function Level of Independence: Needs assistance   Gait / Transfers Assistance Needed: Per son, pt ambulates to meals c SUPERVISION + RW, decreased  mobility in last week  ADL's / Homemaking Assistance Needed: Pt completes bathing and feeding with setup  Comments: per son, patient has declined in functional mobility in the last week with increased falls and inability to get OOB with staff.     Hand Dominance   Dominant Hand: Right    Extremity/Trunk Assessment   Upper Extremity Assessment Upper Extremity  Assessment: Generalized weakness (B UE grossly 3/5; equal grip strength)    Lower Extremity Assessment Lower Extremity Assessment: Generalized weakness (B LE grossly 2/5)       Communication   Communication: HOH  Cognition Arousal/Alertness: Awake/alert Behavior During Therapy: WFL for tasks assessed/performed Overall Cognitive Status: History of cognitive impairments - at baseline                                 General Comments: A&O x1 self only, baseline. Not impulsive, pleasant      General Comments      Exercises Other Exercises Other Exercises: Pt and family educated re: OT role, DME recs, d/c recs, falls prevention, ECS Other Exercises: LBD, toileting, bathing, grooming, sup<>sit, sitting/standing balance/tolerance Other Exercises: Supine B UE/LE ther-ex performed including shoulder flexion, SLRs, and heel slides. 10 reps with min assist.   Assessment/Plan    PT Assessment Patient needs continued PT services  PT Problem List Decreased strength;Decreased activity tolerance;Decreased balance;Decreased mobility;Decreased cognition       PT Treatment Interventions DME instruction;Gait training;Therapeutic exercise;Balance training    PT Goals (Current goals can be found in the Care Plan section)  Acute Rehab PT Goals Patient Stated Goal: to go home PT Goal Formulation: With family Time For Goal Achievement: 12/28/20 Potential to Achieve Goals: Good    Frequency 7X/week   Barriers to discharge        Co-evaluation               AM-PAC PT "6 Clicks" Mobility  Outcome Measure Help needed turning from your back to your side while in a flat bed without using bedrails?: A Lot Help needed moving from lying on your back to sitting on the side of a flat bed without using bedrails?: A Lot Help needed moving to and from a bed to a chair (including a wheelchair)?: Total Help needed standing up from a chair using your arms (e.g., wheelchair or bedside  chair)?: Total Help needed to walk in hospital room?: Total Help needed climbing 3-5 steps with a railing? : Total 6 Click Score: 8    End of Session Equipment Utilized During Treatment: Gait belt Activity Tolerance: Patient limited by fatigue Patient left: in bed;with bed alarm set Nurse Communication: Mobility status PT Visit Diagnosis: Unsteadiness on feet (R26.81);Muscle weakness (generalized) (M62.81);History of falling (Z91.81);Difficulty in walking, not elsewhere classified (R26.2)    Time: 7425-9563 PT Time Calculation (min) (ACUTE ONLY): 33 min   Charges:   PT Evaluation $PT Eval Moderate Complexity: 1 Mod PT Treatments $Therapeutic Exercise: 8-22 mins        Elizabeth Palau, PT, DPT [phone]   Dorcas Melito 12/14/2020, 4:28 PM

## 2020-12-14 NOTE — Evaluation (Signed)
Occupational Therapy Evaluation Patient Details Name: Paul Martinez MRN: [id number] DOB: 1930-07-17 Today's Date: 12/14/2020    History of Present Illness Paul Martinez is an 87 y.o. male with PMH significant for dementia, lives in assisted living, HTN, DM 2, GERD, history of ICH and thrombocytopenia and recent history of stroke in February 2022 was in his usual state of health until yesterday when he was found on the ground next to the toilet. Patient with new left caudate CVA with possible extension into the internal capsule was recently discharged 3 months ago with right MCA stroke   Clinical Impression   Paul Martinez was seen for OT evaluation this date. Prior to hospital admission, pt was MOD I for mobility and setup for ADLs. Pt lives at [facility]. Pt presents to acute OT demonstrating impaired ADL performance and functional mobility 2/2 decreased activity tolerance, poor insight into deficits, and functional strength/ROM/balance defiicts. Pt currently requires MIN A + BUE support static sitting EOB, decreasing to MOD A for facewashing with single UE support. BM noted in bed, pt returned to supine. MAX A for bathing/toileting at bed level, +2 assist for rolling. Pt would benefit from skilled OT to address noted impairments and functional limitations (see below for any additional details) in order to maximize safety and independence while minimizing falls risk and caregiver burden. Upon hospital discharge, recommend STR to maximize pt safety and return to PLOF.     Follow Up Recommendations  SNF    Equipment Recommendations  Other (comment) (TBD)    Recommendations for Other Services       Precautions / Restrictions Precautions Precautions: Fall Restrictions Weight Bearing Restrictions: No      Mobility Bed Mobility Overal bed mobility: Needs Assistance Bed Mobility: Supine to Sit;Sit to Supine;Rolling Rolling: Max assist;+2 for physical assistance   Supine  to sit: Mod assist Sit to supine: Max assist        Transfers                 General transfer comment: Not trialed 2/2 poor dynamic sitting balance and BM in bed    Balance Overall balance assessment: Needs assistance Sitting-balance support: Single extremity supported;Feet supported Sitting balance-Leahy Scale: Poor   Postural control: Posterior lean                                 ADL either performed or assessed with clinical judgement   ADL Overall ADL's : Needs assistance/impaired                                       General ADL Comments: MIN A + BUE support static sitting EOB, decreasing to MOD A for facewashing with single UE support. MAX A for bathing/toileting at bed level, +2 assist for rolling.                  Pertinent Vitals/Pain Pain Assessment: No/denies pain     Hand Dominance Right   Extremity/Trunk Assessment Upper Extremity Assessment Upper Extremity Assessment: Generalized weakness   Lower Extremity Assessment Lower Extremity Assessment: Generalized weakness       Communication Communication Communication: HOH   Cognition Arousal/Alertness: Awake/alert Behavior During Therapy: WFL for tasks assessed/performed Overall Cognitive Status: History of cognitive impairments - at baseline  General Comments: A&O x1 self only, baseline. Not impulsive, pleasant   General Comments       Exercises Exercises: Other exercises Other Exercises Other Exercises: Pt and family educated re: OT role, DME recs, d/c recs, falls prevention, ECS Other Exercises: LBD, toileting, bathing, grooming, sup<>sit, sitting/standing balance/tolerance   Shoulder Instructions      Home Living Family/patient expects to be discharged to:: Assisted living                             Home Equipment: Dan Humphreys - 2 wheels   Additional Comments: Son at bedside provided  history. Pt resident at [facility]      Prior Functioning/Environment Level of Independence: Needs assistance  Gait / Transfers Assistance Needed: Per son, pt ambulates to meals c SUPERVISION + RW, decreased mobility in last week ADL's / Homemaking Assistance Needed: Pt completes bathing and feeding with setup            OT Problem List: Decreased strength;Decreased range of motion;Decreased activity tolerance;Impaired balance (sitting and/or standing);Decreased safety awareness;Decreased knowledge of use of DME or AE      OT Treatment/Interventions: Self-care/ADL training;Therapeutic exercise;Energy conservation;DME and/or AE instruction;Therapeutic activities;Balance training;Patient/family education    OT Goals(Current goals can be found in the care plan section) Acute Rehab OT Goals Patient Stated Goal: to go home OT Goal Formulation: With patient/family Time For Goal Achievement: 12/28/20 Potential to Achieve Goals: Fair ADL Goals Pt Will Perform Grooming: with min assist;sitting Pt Will Perform Upper Body Bathing: with set-up;with supervision;bed level Pt Will Transfer to Toilet: with min guard assist (rolling at bed level)  OT Frequency: Min 1X/week    AM-PAC OT "6 Clicks" Daily Activity     Outcome Measure Help from another person eating meals?: A Little Help from another person taking care of personal grooming?: A Lot Help from another person toileting, which includes using toliet, bedpan, or urinal?: A Lot Help from another person bathing (including washing, rinsing, drying)?: A Lot Help from another person to put on and taking off regular upper body clothing?: A Little Help from another person to put on and taking off regular lower body clothing?: A Lot 6 Click Score: 14   End of Session Nurse Communication: Mobility status  Activity Tolerance: Patient tolerated treatment well Patient left: in bed;with call bell/phone within reach;with bed alarm  set;with nursing/sitter in room;with family/visitor present  OT Visit Diagnosis: Other abnormalities of gait and mobility (R26.89);Muscle weakness (generalized) (M62.81)                Time: 6195-0932 OT Time Calculation (min): 33 min Charges:  OT General Charges $OT Visit: 1 Visit OT Evaluation $OT Eval Low Complexity: 1 Low OT Treatments $Self Care/Home Management : 23-37 mins  Kathie Dike, M.S. OTR/L  12/14/20, 2:50 PM  ascom [phone]

## 2020-12-14 NOTE — Progress Notes (Signed)
     Referral received for Paul Martinez :goals of care discussion. Chart reviewed and updates received. Patient is at the bedside working with physical therapy. Son, Paul Martinez is also present and observing/taking notes. I introduced myself and the role of palliative during Paul Martinez's hospitalization. Son verbalized understanding and appreciation.   GOC meeting scheduled for 12/15/2020 @ 0900 as requested and confirmed by son. Paul Martinez is aware we will meet at patient's bedside.   Thank you for your referral and allowing PMT to assist in Paul Martinez [facility] care.   Willette Alma, AGPCNP-BC Palliative Medicine Team  Phone: [phone] Pager: [phone] Amion: N. Cous[place]   NO CHARGE

## 2020-12-15 DIAGNOSIS — I10 Essential (primary) hypertension: Secondary | ICD-10-CM

## 2020-12-15 DIAGNOSIS — Z66 Do not resuscitate: Secondary | ICD-10-CM

## 2020-12-15 DIAGNOSIS — E119 Type 2 diabetes mellitus without complications: Secondary | ICD-10-CM | POA: Diagnosis not present

## 2020-12-15 DIAGNOSIS — Z7189 Other specified counseling: Secondary | ICD-10-CM

## 2020-12-15 DIAGNOSIS — F0150 Vascular dementia without behavioral disturbance: Secondary | ICD-10-CM | POA: Diagnosis not present

## 2020-12-15 DIAGNOSIS — Z515 Encounter for palliative care: Secondary | ICD-10-CM

## 2020-12-15 DIAGNOSIS — I639 Cerebral infarction, unspecified: Secondary | ICD-10-CM | POA: Diagnosis not present

## 2020-12-15 LAB — CBC
HCT: 35.8 % — ABNORMAL LOW (ref 39.0–52.0)
Hemoglobin: 11.8 g/dL — ABNORMAL LOW (ref 13.0–17.0)
MCH: 31.2 pg (ref 26.0–34.0)
MCHC: 33 g/dL (ref 30.0–36.0)
MCV: 94.7 fL (ref 80.0–100.0)
Platelets: UNDETERMINED 10*3/uL (ref 150–400)
RBC: 3.78 MIL/uL — ABNORMAL LOW (ref 4.22–5.81)
RDW: 13.7 % (ref 11.5–15.5)
WBC: 7.4 10*3/uL (ref 4.0–10.5)
nRBC: 0 % (ref 0.0–0.2)

## 2020-12-15 LAB — GLUCOSE, CAPILLARY
Glucose-Capillary: 87 mg/dL (ref 70–99)
Glucose-Capillary: 233 mg/dL — ABNORMAL HIGH (ref 70–99)
Glucose-Capillary: 164 mg/dL — ABNORMAL HIGH (ref 70–99)
Glucose-Capillary: 264 mg/dL — ABNORMAL HIGH (ref 70–99)

## 2020-12-15 LAB — BASIC METABOLIC PANEL
Anion gap: 6 (ref 5–15)
BUN: 24 mg/dL — ABNORMAL HIGH (ref 8–23)
CO2: 22 mmol/L (ref 22–32)
Calcium: 8.7 mg/dL — ABNORMAL LOW (ref 8.9–10.3)
Chloride: 112 mmol/L — ABNORMAL HIGH (ref 98–111)
Creatinine, Ser: 1.81 mg/dL — ABNORMAL HIGH (ref 0.61–1.24)
GFR, Estimated: 35 mL/min — ABNORMAL LOW (ref >60)
Glucose, Bld: 100 mg/dL — ABNORMAL HIGH (ref 70–99)
Potassium: 4.1 mmol/L (ref 3.5–5.1)
Sodium: 140 mmol/L (ref 135–145)

## 2020-12-15 LAB — RESP PANEL BY RT-PCR (FLU A&B, COVID) ARPGX2
Influenza A by PCR: NEGATIVE
Influenza B by PCR: NEGATIVE
SARS Coronavirus 2 by RT PCR: NEGATIVE

## 2020-12-15 MED ORDER — SODIUM CHLORIDE 0.9 % IV SOLN: INTRAVENOUS | Status: DC

## 2020-12-15 NOTE — Progress Notes (Signed)
[facility]                                                                              Patient Demographics  Paul Martinez, is a 87 y.o. male, DOB - 10-18-1929, KGM:[id number]  Admit date - 12/13/2020   Admitting Physician Pieter Partridge, MD  Outpatient Primary MD for the patient is Barbette Reichmann, MD  Outpatient specialists:   LOS - 2  days   Medical records reviewed and are as summarized below:    Chief Complaint  Patient presents with  . Fall  . Altered Mental Status       Brief summary   Patient is a 87 year old male with history of dementia, lives in ALF, hypertension, diabetes mellitus type 2, GERD, history of ICH, thrombocytopenia and recent history of stroke in February 2022, was in his usual state of health until yesterday when he was found on the ground next to the toilet.  He had not lost consciousness and at that time he stated he was unable to pull his pants up and fell onto his side when he was trying to do that.  Patient was assisted back to the bed by staff.  It was unclear how long he had been on the floor.    On the morning of admission, when nurses aide went to check on him, patient was unable to get up out of bed and into his chair.  He was unable to even just stand up. Patient was brought to ED, underwent CT scan which showed new stroke in the left caudate nucleus possibly extended into the internal capsule. Patient was admitted due to acute CVA  Assessment & Plan    Principal Problem: Acute left caudate nucleus CVA -In the setting of recent right MCA CVA in 09/2020.  Patient was placed on aspirin, Plavix and atorvastatin in 09/2020. -Neuro consulted.  MRI brain showed acute infarct left anterior basal ganglia, atrophy and extensive chronic ischemic changes -Recent stroke work have been completed, including echo in 09/2020 showed EF of 60 to 65% grade 3 DD -PT evaluation recommended SNF, TOC consult placed, patient's son  agreeable with the plan and have -LDL 55, continue statin -Neurology recommended interrogation of loop recorder.  Per cardiology, no A. fib noted -Continue dual antiplatelet agents for 21 days -Per son's request, palliative consulted, DNR/DNI -SLP evaluation recommended dysphagia 3 diet with thin liquids  Active Problems: Acute kidney injury  -Creatinine 1.7 in 2/22, presented with creatinine of 1.93 on admission -Creatinine 1.8, placed on gentle hydration      Diabetes mellitus type II, not on long-term insulin, with hyperlipidemia, stroke -Hold metformin, glipizide -Continue sliding scale insulin while inpatient    Hypertension -Currently stable, hold lisinopril    Dementia (HCC) Continue Zoloft, Wellbutrin, Aricept   Obesity  Estimated body mass index is 30.89 kg/m as calculated from the following:   Height as of this encounter: 5\' 11"  (1.803 m).   Weight as of this encounter: 100.5 kg.  Code Status DNR DVT Prophylaxis:  SCD's Start: 12/13/20 1654   Level of Care: Level of care: Progressive Cardiac Family Communication: Discussed all imaging  results, lab results, explained to the patient and son at bedside today   Disposition Plan:     Status is: Inpatient  Remains inpatient appropriate because:Inpatient level of care appropriate due to severity of illness   Dispo: The patient is from: ALF              Anticipated d/c is to: SNF              Patient currently is not medically stable to d/c. SNF pending, likely DC in am     Difficult to place patient No      Time Spent in minutes 25 minutes  Procedures:  MRI brain Loop recorder interrogation  Consultants:   Neurology Palliative medicine  Antimicrobials:   Anti-infectives (From admission, onward)   None         Medications  Scheduled Meds: . aspirin EC  81 mg Oral Daily  . atorvastatin  40 mg Oral Daily  . buPROPion  150 mg Oral Daily  . clopidogrel  75 mg Oral Daily  . donepezil  10 mg  Oral QHS  . feeding supplement  237 mL Oral TID BM  . insulin aspart  0-9 Units Subcutaneous TID WC  . multivitamin with minerals  1 tablet Oral Daily  . pantoprazole  20 mg Oral Daily  . sertraline  100 mg Oral Daily   Continuous Infusions: PRN Meds:.acetaminophen **OR** acetaminophen (TYLENOL) oral liquid 160 mg/5 mL **OR** acetaminophen      Subjective:   Dyan Creelman was seen and examined today.  No acute complaints, no change in strength.  Son at the bedside.   Difficult to obtain review of system from patient.  No acute events overnight  Objective:   Vitals:   12/15/20 0438 12/15/20 0800 12/15/20 0832 12/15/20 1100  BP: (!) 145/66  (!) 154/66   Pulse:   (!) 59   Resp: 15  18   Temp: 98 F (36.7 C)  98 F (36.7 C) 98.1 F (36.7 C)  TempSrc: Oral   Oral  SpO2: 97%  97%   Weight: 100.5 kg 100.5 kg    Height:        Intake/Output Summary (Last 24 hours) at 12/15/2020 1528 Last data filed at 12/15/2020 1345 Gross per 24 hour  Intake 480 ml  Output 1150 ml  Net -670 ml     Wt Readings from Last 3 Encounters:  12/15/20 100.5 kg  11/30/20 90.7 kg  11/07/20 98.4 kg   Physical Exam  General: Alert and oriented x self, NAD  Cardiovascular: S1 S2 clear, RRR. No pedal edema b/l  Respiratory: CTA B  Gastrointestinal: Soft, nontender, nondistended, NBS  Ext: no pedal edema bilaterally  Neuro: no new deficits  Musculoskeletal: No cyanosis, clubbing  Skin: No rashes  Psych: dementia   Data Reviewed:  I have personally reviewed following labs and imaging studies  Micro Results Recent Results (from the past 240 hour(s))  Resp Panel by RT-PCR (Flu A&B, Covid) Nasopharyngeal Swab     Status: None   Collection Time: 12/13/20  1:25 PM   Specimen: Nasopharyngeal Swab; Nasopharyngeal(NP) swabs in vial transport medium  Result Value Ref Range Status   SARS Coronavirus 2 by RT PCR NEGATIVE NEGATIVE Final    Comment: (NOTE) SARS-CoV-2 target nucleic acids are  NOT DETECTED.  The SARS-CoV-2 RNA is generally detectable in upper respiratory specimens during the acute phase of infection. The lowest concentration of SARS-CoV-2 viral copies this assay can detect is 138  copies/mL. A negative result does not preclude SARS-Cov-2 infection and should not be used as the sole basis for treatment or other patient management decisions. A negative result may occur with  improper specimen collection/handling, submission of specimen other than nasopharyngeal swab, presence of viral mutation(s) within the areas targeted by this assay, and inadequate number of viral copies(<138 copies/mL). A negative result must be combined with clinical observations, patient history, and epidemiological information. The expected result is Negative.  Fact Sheet for Patients:  BloggerCourse.comhttps://www.fda.gov/media/152166/download  Fact Sheet for Healthcare Providers:  SeriousBroker.ithttps://www.fda.gov/media/152162/download  This test is no t yet approved or cleared by the [place] FDA and  has been authorized for detection and/or diagnosis of SARS-CoV-2 by FDA under an Emergency Use Authorization (EUA). This EUA will remain  in effect (meaning this test can be used) for the duration of the COVID-19 declaration under Section 564(b)(1) of the Act, 21 U.S.C.section 360bbb-3(b)(1), unless the authorization is terminated  or revoked sooner.       Influenza A by PCR NEGATIVE NEGATIVE Final   Influenza B by PCR NEGATIVE NEGATIVE Final    Comment: (NOTE) The Xpert Xpress SARS-CoV-2/FLU/RSV plus assay is intended as an aid in the diagnosis of influenza from Nasopharyngeal swab specimens and should not be used as a sole basis for treatment. Nasal washings and aspirates are unacceptable for Xpert Xpress SARS-CoV-2/FLU/RSV testing.  Fact Sheet for Patients: BloggerCourse.comhttps://www.fda.gov/media/152166/download  Fact Sheet for Healthcare Providers: SeriousBroker.ithttps://www.fda.gov/media/152162/download  This test is not  yet approved or cleared by the [place] FDA and has been authorized for detection and/or diagnosis of SARS-CoV-2 by FDA under an Emergency Use Authorization (EUA). This EUA will remain in effect (meaning this test can be used) for the duration of the COVID-19 declaration under Section 564(b)(1) of the Act, 21 U.S.C. section 360bbb-3(b)(1), unless the authorization is terminated or revoked.  Performed at [facility], [address] [place], [place] [address]   Resp Panel by RT-PCR (Flu A&B, Covid) Nasopharyngeal Swab     Status: None   Collection Time: 12/15/20  1:16 PM   Specimen: Nasopharyngeal Swab; Nasopharyngeal(NP) swabs in vial transport medium  Result Value Ref Range Status   SARS Coronavirus 2 by RT PCR NEGATIVE NEGATIVE Final    Comment: (NOTE) SARS-CoV-2 target nucleic acids are NOT DETECTED.  The SARS-CoV-2 RNA is generally detectable in upper respiratory specimens during the acute phase of infection. The lowest concentration of SARS-CoV-2 viral copies this assay can detect is 138 copies/mL. A negative result does not preclude SARS-Cov-2 infection and should not be used as the sole basis for treatment or other patient management decisions. A negative result may occur with  improper specimen collection/handling, submission of specimen other than nasopharyngeal swab, presence of viral mutation(s) within the areas targeted by this assay, and inadequate number of viral copies(<138 copies/mL). A negative result must be combined with clinical observations, patient history, and epidemiological information. The expected result is Negative.  Fact Sheet for Patients:  BloggerCourse.comhttps://www.fda.gov/media/152166/download  Fact Sheet for Healthcare Providers:  SeriousBroker.ithttps://www.fda.gov/media/152162/download  This test is no t yet approved or cleared by the [place] FDA and  has been authorized for detection and/or diagnosis of SARS-CoV-2 by FDA under an Emergency Use  Authorization (EUA). This EUA will remain  in effect (meaning this test can be used) for the duration of the COVID-19 declaration under Section 564(b)(1) of the Act, 21 U.S.C.section 360bbb-3(b)(1), unless the authorization is terminated  or revoked sooner.       Influenza A  by PCR NEGATIVE NEGATIVE Final   Influenza B by PCR NEGATIVE NEGATIVE Final    Comment: (NOTE) The Xpert Xpress SARS-CoV-2/FLU/RSV plus assay is intended as an aid in the diagnosis of influenza from Nasopharyngeal swab specimens and should not be used as a sole basis for treatment. Nasal washings and aspirates are unacceptable for Xpert Xpress SARS-CoV-2/FLU/RSV testing.  Fact Sheet for Patients: BloggerCourse.com  Fact Sheet for Healthcare Providers: SeriousBroker.it  This test is not yet approved or cleared by the [place] FDA and has been authorized for detection and/or diagnosis of SARS-CoV-2 by FDA under an Emergency Use Authorization (EUA). This EUA will remain in effect (meaning this test can be used) for the duration of the COVID-19 declaration under Section 564(b)(1) of the Act, 21 U.S.C. section 360bbb-3(b)(1), unless the authorization is terminated or revoked.  Performed at [facility], [address], [place], [place] [address]     Radiology Reports CT Head Wo Contrast  Result Date: 12/13/2020 CLINICAL DATA:  Unwitnessed fall. Found on floor this morning. Mental status change. EXAM: CT HEAD WITHOUT CONTRAST TECHNIQUE: Contiguous axial images were obtained from the base of the skull through the vertex without intravenous contrast. COMPARISON:  10/04/2020 FINDINGS: Brain: There is hypoattenuation involving a significant portion of the left caudate nucleus head, new since the prior CT. This is consistent with a recent infarct. No other evidence of a recent infarct. Small area old infarction involving the left occipital lobe, with  another area involving the left cerebellum, both stable. Age related ventricular and sulcal enlargement. No hydrocephalus. Bilateral patchy white hypoattenuation is noted consistent with advanced chronic microvascular ischemic change, stable. There are no parenchymal masses or mass effect. No extra-axial masses or abnormal fluid collections. No intracranial hemorrhage. Vascular: No hyperdense vessel or unexpected calcification. Skull: Normal. Negative for fracture or focal lesion. Sinuses/Orbits: Globes and orbits are unremarkable. Visualized sinuses are clear. Other: None. IMPRESSION: 1. Infarct involving a significant portion of the left caudate nucleus head, possibly extending to the genu and anterior limb of the left internal capsule. This is new since the prior head CT and is suspected to be recent. Further assessment, to establish chronicity, with unenhanced brain MRI is suggested. 2. No other evidence of a recent infarct. 3. No intracranial hemorrhage. 4. Chronic changes of advanced chronic microvascular ischemic change as well as old left occipital and left cerebellar infarcts. Electronically Signed   By: Amie Portland M.D.   On: 12/13/2020 14:32   MR BRAIN WO CONTRAST  Result Date: 12/13/2020 CLINICAL DATA:  Acute neuro deficit.  Stroke. EXAM: MRI HEAD WITHOUT CONTRAST TECHNIQUE: Multiplanar, multiecho pulse sequences of the brain and surrounding structures were obtained without intravenous contrast. COMPARISON:  CT head 12/13/2020.  MRI head 09/21/2020 FINDINGS: Brain: Acute infarct in the left basal ganglia involving the head of caudate, anterior limb internal capsule, and anterior putamen. No other acute infarct. Resolving small acute infarct in the right parietal lobe which shows restricted diffusion and February 2022. Moderate atrophy. Extensive chronic microvascular ischemic change throughout the white matter bilaterally. Chronic infarcts in the basal ganglia bilaterally, chronic infarct in the  right midbrain, and chronic infarcts in the cerebellum bilaterally. Chronic infarct occipital pole bilaterally left greater than right. Negative for hemorrhage or mass. Vascular: Abnormal flow void in the distal right vertebral artery which may be occluded or have slow flow. This is unchanged. Otherwise normal arterial flow voids. Skull and upper cervical spine: Negative Sinuses/Orbits: Paranasal sinuses clear. Bilateral cataract extraction Other:  None IMPRESSION: Acute infarct left anterior basal ganglia. Atrophy and extensive chronic ischemic change. Electronically Signed   By: Marlan Palau M.D.   On: 12/13/2020 16:21   DG Chest Portable 1 View  Result Date: 12/13/2020 CLINICAL DATA:  Fall today.  Weakness.  Altered mental status. EXAM: PORTABLE CHEST 1 VIEW COMPARISON:  09/06/2013 FINDINGS: Cardiac silhouette is top-normal in size. No mediastinal or hilar masses. Clear lungs.  No convincing pleural effusion.  No pneumothorax. Skeletal structures are grossly intact. IMPRESSION: No active disease. Electronically Signed   By: Amie Portland M.D.   On: 12/13/2020 14:25    Lab Data:  CBC: Recent Labs  Lab 12/13/20 1325 12/15/20 0400  WBC 8.7 7.4  HGB 12.1* 11.8*  HCT 36.4* 35.8*  MCV 95.0 94.7  PLT 101* PLATELET CLUMPS NOTED ON SMEAR, UNABLE TO ESTIMATE   Basic Metabolic Panel: Recent Labs  Lab 12/13/20 1325 12/15/20 0400  NA 142 140  K 4.2 4.1  CL 110 112*  CO2 23 22  GLUCOSE 111* 100*  BUN 24* 24*  CREATININE 1.93* 1.81*  CALCIUM 9.1 8.7*   GFR: Estimated Creatinine Clearance: 33.4 mL/min (A) (by C-G formula based on SCr of 1.81 mg/dL (H)). Liver Function Tests: Recent Labs  Lab 12/13/20 1325  AST 31  ALT 21  ALKPHOS 101  BILITOT 1.3*  PROT 6.4*  ALBUMIN 3.5   No results for input(s): LIPASE, AMYLASE in the last 168 hours. No results for input(s): AMMONIA in the last 168 hours. Coagulation Profile: No results for input(s): INR, PROTIME in the last 168 hours. Cardiac  Enzymes: Recent Labs  Lab 12/13/20 1714 12/14/20 0359 12/14/20 1658  CKTOTAL 67 171 566*   BNP (last 3 results) No results for input(s): PROBNP in the last 8760 hours. HbA1C: Recent Labs    12/13/20 1714 12/14/20 0359  HGBA1C 5.8* 5.7*   CBG: Recent Labs  Lab 12/14/20 1156 12/14/20 1628 12/14/20 2000 12/15/20 0821 12/15/20 1148  GLUCAP 126* 154* 105* 87 233*   Lipid Profile: Recent Labs    12/14/20 0359  CHOL 115  HDL 42  LDLCALC 55  TRIG 88  CHOLHDL 2.7   Thyroid Function Tests: No results for input(s): TSH, T4TOTAL, FREET4, T3FREE, THYROIDAB in the last 72 hours. Anemia Panel: No results for input(s): VITAMINB12, FOLATE, FERRITIN, TIBC, IRON, RETICCTPCT in the last 72 hours. Urine analysis:    Component Value Date/Time   COLORURINE YELLOW (A) 10/04/2020 1837   APPEARANCEUR CLEAR (A) 10/04/2020 1837   APPEARANCEUR Clear 09/06/2013 0739   LABSPEC 1.018 10/04/2020 1837   LABSPEC 1.020 09/06/2013 0739   PHURINE 5.0 10/04/2020 1837   GLUCOSEU NEGATIVE 10/04/2020 1837   GLUCOSEU Negative 09/06/2013 0739   HGBUR SMALL (A) 10/04/2020 1837   BILIRUBINUR NEGATIVE 10/04/2020 1837   BILIRUBINUR Negative 09/06/2013 0739   KETONESUR NEGATIVE 10/04/2020 1837   PROTEINUR NEGATIVE 10/04/2020 1837   NITRITE NEGATIVE 10/04/2020 1837   LEUKOCYTESUR SMALL (A) 10/04/2020 1837   LEUKOCYTESUR Negative 09/06/2013 0739     Joell Usman M.D. [facility] 12/15/2020, 3:28 PM  Available via Epic secure chat 7am-7pm After 7 pm, please refer to night coverage provider listed on amion.

## 2020-12-15 NOTE — Progress Notes (Signed)
Physical Therapy Treatment Patient Details Name: Paul Martinez MRN: [id number] DOB: 09/04/1929 Today's Date: 12/15/2020    History of Present Illness Paul Martinez is an 87 y.o. male with PMH significant for dementia, lives in assisted living, HTN, DM 2, GERD, history of ICH and thrombocytopenia and recent history of stroke in February 2022 was in his usual state of health until yesterday when he was found on the ground next to the toilet. Patient with new left caudate CVA with possible extension into the internal capsule was recently discharged 3 months ago with right MCA stroke    PT Comments    Pt is making good progress towards goals with ability to stand and ambulate to recliner. Still requires +2 assist and elevated surface for most success. Pillow placed in chair to elevate seat and son updated on assist level. Able to participate in there-ex, still remains confused, although pleasant. Able to stand for increased time this session for hygiene. Will continue to progress as able.   Follow Up Recommendations  SNF     Equipment Recommendations  None recommended by PT    Recommendations for Other Services       Precautions / Restrictions Precautions Precautions: Fall Restrictions Weight Bearing Restrictions: No    Mobility  Bed Mobility Overal bed mobility: Needs Assistance Bed Mobility: Supine to Sit     Supine to sit: Mod assist     General bed mobility comments: needs cues for sequencing and B LE. Heavy assist for trunk mobility, tends to demonstrate R lateral lean, however able to self correct with cues.    Transfers Overall transfer level: Needs assistance Equipment used: Rolling walker (2 wheeled) Transfers: Sit to/from Stand Sit to Stand: Max assist;+2 physical assistance;From elevated surface         General transfer comment: Pt able to stand for longer time period this session. Still needed max assist and elevated bed, however able to progress and only  require min assist during static standing.  Ambulation/Gait Ambulation/Gait assistance: Mod assist;+2 physical assistance Gait Distance (Feet): 3 Feet Assistive device: Rolling walker (2 wheeled) Gait Pattern/deviations: Step-to pattern;Shuffle     General Gait Details: ambulated with shuffle gait from bed->chair. RW used with cues for sequencing. Pt fatigued with exertion.   Stairs             Wheelchair Mobility    Modified Rankin (Stroke Patients Only)       Balance Overall balance assessment: Needs assistance Sitting-balance support: Feet supported;Bilateral upper extremity supported Sitting balance-Leahy Scale: Fair Sitting balance - Comments: R lateral lean                                    Cognition Arousal/Alertness: Awake/alert Behavior During Therapy: WFL for tasks assessed/performed Overall Cognitive Status: History of cognitive impairments - at baseline                                 General Comments: A&O x1 self only, baseline. Not impulsive, pleasant      Exercises Other Exercises Other Exercises: supine B LE ther-ex performed including SAQ, SLR, hip abd/add, and AP. 15-20 reps performed with cga Other Exercises: Able to stand for prolonged time with +2 assist while CNA performed hygiene from incontient episode    General Comments        Pertinent Vitals/Pain Pain  Assessment: No/denies pain    Home Living                      Prior Function            PT Goals (current goals can now be found in the care plan section) Acute Rehab PT Goals Patient Stated Goal: to go home PT Goal Formulation: With family Time For Goal Achievement: 12/28/20 Potential to Achieve Goals: Good Progress towards PT goals: Progressing toward goals    Frequency    7X/week      PT Plan Current plan remains appropriate    Co-evaluation              AM-PAC PT "6 Clicks" Mobility   Outcome Measure  Help  needed turning from your back to your side while in a flat bed without using bedrails?: A Lot Help needed moving from lying on your back to sitting on the side of a flat bed without using bedrails?: A Lot Help needed moving to and from a bed to a chair (including a wheelchair)?: A Lot Help needed standing up from a chair using your arms (e.g., wheelchair or bedside chair)?: A Lot Help needed to walk in hospital room?: A Lot Help needed climbing 3-5 steps with a railing? : Total 6 Click Score: 11    End of Session Equipment Utilized During Treatment: Gait belt Activity Tolerance: Patient limited by fatigue Patient left: in chair;with chair alarm set (pt able to demonstrate ability to remove seat belt alarm) Nurse Communication: Mobility status PT Visit Diagnosis: Unsteadiness on feet (R26.81);Muscle weakness (generalized) (M62.81);History of falling (Z91.81);Difficulty in walking, not elsewhere classified (R26.2)     Time: 1440-1520 PT Time Calculation (min) (ACUTE ONLY): 40 min  Charges:  $Gait Training: 8-22 mins $Therapeutic Exercise: 8-22 mins $Therapeutic Activity: 8-22 mins                     Elizabeth Palau, PT, DPT [phone]    Necia Kamm 12/15/2020, 4:34 PM

## 2020-12-15 NOTE — [place] FL2 (Signed)
[place] MEDICAID FL2 LEVEL OF CARE SCREENING TOOL     IDENTIFICATION  Patient Name: Paul Martinez Birthdate: Jun 03, 1930 Sex: male Admission Date (Current Location): 12/13/2020  [place] and [place] Number:  Chiropodist and Address:  [facility], [address] [place], [place] [address]      Provider Number: [id number]  Attending Physician Name and Address:  Cathren Harsh, MD  Relative Name and Phone Number:       Current Level of Care: Hospital Recommended Level of Care: Skilled Nursing Facility Prior Approval Number:    Date Approved/Denied:   PASRR Number: [id number]  Discharge Plan: SNF    Current Diagnoses: Patient Active Problem List   Diagnosis Date Noted  . Left arm weakness 09/21/2020  . Dementia (HCC) 09/21/2020  . Stroke (HCC) 09/21/2020  . Diabetes mellitus without complication (HCC)   . Hypertension   . GERD (gastroesophageal reflux disease)   . Depression   . Thrombocytopenia (HCC)   . ICH (intracerebral hemorrhage) (HCC) 09/30/2017  . DIZZINESS 02/23/2010  . DYSPNEA 02/23/2010  . ABNORMAL ELECTROCARDIOGRAM 02/23/2010    Orientation RESPIRATION BLADDER Height & Weight     Self  Normal Incontinent Weight: 221 lb 8 oz (100.5 kg) Height:  5\' 11"  (180.3 cm)  BEHAVIORAL SYMPTOMS/MOOD NEUROLOGICAL BOWEL NUTRITION STATUS      Incontinent Diet (DYS 3 diet)  AMBULATORY STATUS COMMUNICATION OF NEEDS Skin   Extensive Assist Verbally Normal                       Personal Care Assistance Level of Assistance  Dressing,Feeding,Bathing Bathing Assistance: Maximum assistance Feeding assistance: Independent Dressing Assistance: Maximum assistance     Functional Limitations Info  Sight,Hearing,Speech Sight Info: Adequate Hearing Info: Adequate Speech Info: Adequate    SPECIAL CARE FACTORS FREQUENCY  PT (By licensed PT),OT (By licensed OT)     PT Frequency: 5x OT Frequency: 5x             Contractures Contractures Info: Not present    Additional Factors Info  Code Status,Allergies Code Status Info: DNR Allergies Info: Penicillins           Current Medications (12/15/2020):  This is the current hospital active medication list Current Facility-Administered Medications  Medication Dose Route Frequency Provider Last Rate Last Admin  . acetaminophen (TYLENOL) tablet 650 mg  650 mg Oral Q4H PRN 02/14/2021, MD       Or  . acetaminophen (TYLENOL) 160 MG/5ML solution 650 mg  650 mg Per Tube Q4H PRN Pieter Partridge, MD       Or  . acetaminophen (TYLENOL) suppository 650 mg  650 mg Rectal Q4H PRN Pieter Partridge, MD      . aspirin EC tablet 81 mg  81 mg Oral Daily Pieter Partridge Tublu, MD   81 mg at 12/15/20 02/14/21  . atorvastatin (LIPITOR) tablet 40 mg  40 mg Oral Daily 3428 Tublu, MD   40 mg at 12/15/20 02/14/21  . buPROPion [facility] SR) 12 hr tablet 150 mg  150 mg Oral Daily [facility] Tublu, MD   150 mg at 12/15/20 02/14/21  . clopidogrel (PLAVIX) tablet 75 mg  75 mg Oral Daily 1572 Tublu, MD   75 mg at 12/15/20 0834  . donepezil (ARICEPT) tablet 10 mg  10 mg Oral QHS 02/14/21 Tublu, MD   10 mg at 12/14/20 2016  . feeding supplement (ENSURE ENLIVE /  ENSURE PLUS) liquid 237 mL  237 mL Oral TID BM Rai, Ripudeep K, MD   237 mL at 12/15/20 1010  . insulin aspart (novoLOG) injection 0-9 Units  0-9 Units Subcutaneous TID WC Pieter Partridge, MD   3 Units at 12/15/20 1214  . multivitamin with minerals tablet 1 tablet  1 tablet Oral Daily Rai, Ripudeep K, MD   1 tablet at 12/15/20 0834  . pantoprazole (PROTONIX) EC tablet 20 mg  20 mg Oral Daily Leandro Reasoner Tublu, MD   20 mg at 12/15/20 1478  . sertraline (ZOLOFT) tablet 100 mg  100 mg Oral Daily Leandro Reasoner Tublu, MD   100 mg at 12/15/20 2956     Discharge Medications: Please see discharge summary for a list of discharge  medications.  Relevant Imaging Results:  Relevant Lab Results:   Additional Information ssn: [id number]  Maree Krabbe, LCSW

## 2020-12-15 NOTE — Consult Note (Signed)
Consultation Note Date: 12/15/2020   Patient Name: Paul Martinez  DOB: [birth year]  MRN: [id number]  Age / Sex: 87 y.o., male   PCP: Paul Harrier, MD Referring Physician: Mendel Corning, MD   REASON FOR CONSULTATION:Establishing goals of care  Palliative Care consult requested for goals of care discussion in this 87 y.o. male with a medical history significant for tension, diabetes type 2, GERD, ICH, thrombocytopenia, dementia, and stroke.  Patient presented to the ED via EMS from [facility] facility with concerns of weakness s/p fall.  Code stroke was not called due to last known normal was the previous night.  CT scan showed new stroke in the left caudate Nucleus possibly extending into the internal capsule.  Patient has been evaluated by SLP with recommendations for dysphagia 3 diet and thin liquids.  Clinical Assessment and Goals of Care: I have reviewed medical records including lab results, imaging, Epic notes, and MAR, received report from the bedside RN, and assessed the patient.   I met at the bedside with patient and his son Paul Martinez to discuss diagnosis prognosis, [place], EOL wishes, disposition and options.  Paul Martinez is awake and alert.  Answer some questions appropriately.  Denies pain or shortness of breath.  I introduced Palliative Medicine as specialized medical care for people living with serious illness. It focuses on providing relief from the symptoms and stress of a serious illness. The goal is to improve quality of life for both the patient and the family.  We discussed a brief life review of the patient, along with his functional and nutritional status.  [facility].  Prior to this he [place] with his wife who unfortunately passed away in 2017/11/04 after 31 years of marriage.  Patient had 2 sons (unfortunately his eldest son passed away several years ago due to suicide).  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  Prior to admission patient was ambulatory with standby and walker assistance.  Son states at times he will forget his walker.  His appetite was good with no significant weight loss.  Son does acknowledge memory loss due to his dementia however he recognizes family and familiar faces.  Patient is active and social with games at facility.  We discussed His current illness and what it means in the larger context of His on-going co-morbidities. Natural disease trajectory and expectations at EOL were discussed.  A detailed discussion was had today regarding advanced directives.  Concepts specific to code status, artifical feeding and hydration, continued IV antibiotics and rehospitalization.  Son verbalizes understanding of patient's comorbidities.  He confirms DNR/DNI.  Paul Martinez states patient has been clear for no life prolonging measures and would not want any forms of artificial feeding/PEG tube.  Son educated on MOST form and requested to complete. The patient and family outlined their wishes for the following treatment decisions:  Cardiopulmonary Resuscitation: Do Not Attempt Resuscitation (DNR/No CPR)  Medical Interventions: Limited Additional Interventions: Use medical treatment, IV fluids and cardiac monitoring as indicated, DO NOT USE intubation or mechanical ventilation. May consider use of less invasive airway support such as BiPAP or CPAP. Also provide comfort measures. Transfer to the [facility] if indicated. Avoid intensive care.   Antibiotics: Antibiotics if indicated  IV Fluids: IV fluids if indicated  Feeding Tube: No feeding tube   Values and goals of care  important to patient and family were attempted to be elicited.   Patient and son are clear and expressed goals to continue to treat the treatable.  Son remains hopeful patient will continue to show some improvement in the ability to return back to his facility.   He states patient may require memory care unit at this point.  Paul Martinez states if patient's health was to further decline he would then wish to focus on his comfort and end-of-life.   I discussed the importance of continued conversation with family and their medical providers regarding overall plan of care and treatment options, ensuring decisions are within the context of the patients values and GOCs.  Hospice and Palliative Care services outpatient were explained and offered. Patient and family verbalized their understanding and awareness of both palliative and hospice's goals and philosophy of care.  Son is requesting outpatient palliative support.  Questions and concerns were addressed. The family was encouraged to call with questions or concerns.  PMT will continue to support holistically as needed.   CODE STATUS: DNR  ADVANCE DIRECTIVES: Primary Decision Maker: Paul Martinez   SYMPTOM MANAGEMENT: Per attending  Palliative Prophylaxis:   Delirium Protocol  PSYCHO-SOCIAL/SPIRITUAL:  Support System: Family Desire for further Chaplaincy support: No  Additional Recommendations (Limitations, Scope, Preferences):  No Artificial Feeding, No Surgical Procedures and DNR/DNI, treat the treatable  Education on hospice/palliative    PAST MEDICAL HISTORY: Past Medical History:  Diagnosis Date  . Diabetes mellitus without complication ([place])   . GERD (gastroesophageal reflux disease)   . Hypertension   . Stroke ([place])   . Thrombocytopenia (HCC)     ALLERGIES:  is allergic to penicillins.   MEDICATIONS:  Current Facility-Administered Medications  Medication Dose Route Frequency Provider Last Rate Last Admin  . acetaminophen (TYLENOL) tablet 650 mg  650 mg Oral Q4H PRN Paul Hey, MD       Or  . acetaminophen (TYLENOL) 160 MG/5ML solution 650 mg  650 mg Per Tube Q4H PRN Paul Hey, MD       Or  . acetaminophen (TYLENOL) suppository 650 mg  650 mg Rectal  Q4H PRN Paul Hey, MD      . aspirin EC tablet 81 mg  81 mg Oral Daily Paul Public Tublu, MD   81 mg at 12/15/20 9417  . atorvastatin (LIPITOR) tablet 40 mg  40 mg Oral Daily Paul Public Tublu, MD   40 mg at 12/15/20 4081  . buPROPion [facility] SR) 12 hr tablet 150 mg  150 mg Oral Daily Paul Public Tublu, MD   150 mg at 12/15/20 4481  . clopidogrel (PLAVIX) tablet 75 mg  75 mg Oral Daily Paul Public Tublu, MD   75 mg at 12/15/20 0834  . donepezil (ARICEPT) tablet 10 mg  10 mg Oral QHS Paul Public Tublu, MD   10 mg at 12/14/20 2016  . feeding supplement (ENSURE ENLIVE / ENSURE PLUS) liquid 237 mL  237 mL Oral TID BM Rai, Ripudeep K, MD   237 mL at 12/14/20 1950  . insulin aspart (novoLOG) injection 0-9 Units  0-9 Units Subcutaneous TID WC Paul Hey, MD   2 Units at 12/14/20 1636  . multivitamin with minerals tablet 1 tablet  1 tablet Oral Daily Rai, Ripudeep K, MD   1 tablet at 12/15/20 0834  . pantoprazole (PROTONIX) EC tablet 20 mg  20 mg Oral Daily Paul Public Tublu, MD   20 mg at 12/15/20 8563  . sertraline (ZOLOFT)  tablet 100 mg  100 mg Oral Daily Paul Public Tublu, MD   100 mg at 12/15/20 0834    VITAL SIGNS: BP (!) 154/66   Pulse (!) 59   Temp 98 F (36.7 C)   Resp 18   Ht $R'5\' 11"'Qm$  (1.803 m)   Wt 100.5 kg   SpO2 97%   BMI 30.89 kg/m  Filed Weights   12/13/20 1319 12/15/20 0438  Weight: 97.9 kg 100.5 kg    Estimated body mass index is 30.89 kg/m as calculated from the following:   Height as of this encounter: $RemoveBeforeD'5\' 11"'YXmLpNgZFZDjTq$  (1.803 m).   Weight as of this encounter: 100.5 kg.  LABS: CBC:    Component Value Date/Time   WBC 7.4 12/15/2020 0400   HGB 11.8 (L) 12/15/2020 0400   HGB 14.6 09/07/2013 0505   HCT 35.8 (L) 12/15/2020 0400   HCT 43.9 09/07/2013 0505   PLT PLATELET CLUMPS NOTED ON SMEAR, UNABLE TO ESTIMATE 12/15/2020 0400   PLT 128 (L) 09/07/2013 0505   Comprehensive Metabolic  Panel:    Component Value Date/Time   NA 140 12/15/2020 0400   NA 139 09/07/2013 0505   K 4.1 12/15/2020 0400   K 4.3 09/07/2013 0505   BUN 24 (H) 12/15/2020 0400   BUN 23 (H) 09/07/2013 0505   CREATININE 1.81 (H) 12/15/2020 0400   CREATININE 1.93 (H) 09/07/2013 0505   ALBUMIN 3.5 12/13/2020 1325   ALBUMIN 3.9 09/06/2013 0544     Review of Systems Unless otherwise noted, a complete review of systems is negative.  Physical Exam General: NAD, chronically-ill appearing Cardiovascular: regular rate and rhythm Pulmonary: diminished bilaterally  Abdomen: soft, nontender, + bowel sounds Extremities: no edema, no joint deformities Skin: no rashes, warm and dry Neurological: Dementia, mood appropriate, will follow  commands   Prognosis: Unable to determine  Discharge Planning:  To Be Determined with outpatient palliative support  Recommendations: . DNR/DNI-as confirmed by son Allen/HCPOA . MOST education provided and completed at son's request.  See above . Continue with current plan of care, son is clear and expressed goals to continue to treat the treatable.  He remains hopeful patient will continue to show some improvement/stability.  If patient was to further decline he would then wish to focus on his comfort. . Son is requesting outpatient palliative support.  (TOC referral) . PMT will continue to support and follow as needed. Please call team line with urgent needs.   Palliative Performance Scale: PPS 30%              Son Paul Martinez expressed understanding and was in agreement with this plan.   Thank you for allowing the Palliative Medicine Team to assist in the care of this patient. Please utilize secure chat with additional questions, if there is no response within 30 minutes please call the above phone number.   Time In: 0905 Time Out: 1015 Time Total: 70 min.   Visit consisted of counseling and education dealing with the complex and emotionally intense issues of  symptom management and palliative care in the setting of serious and potentially life-threatening illness.Greater than 50%  of this time was spent counseling and coordinating care related to the above assessment and plan.  Signed by:  Alda Lea, AGPCNP-BC Palliative Medicine Team  Phone: [phone] Pager: [phone] Amion: [place] Team providers are available by phone from 7am to 7pm daily and can be reached through the team cell phone.  Should this patient require  assistance outside of these hours, please call the patient's attending physician.

## 2020-12-15 NOTE — TOC Initial Note (Signed)
Transition of Care [facility]) - Initial/Assessment Note    Patient Details  Name: Paul Martinez MRN: [id number] Date of Birth: 01-03-1930  [facility]) CM/SW Contact:    Maree Krabbe, LCSW Phone Number: 12/15/2020, 3:12 PM  Clinical Narrative:        CSW spoke with pt's son via telephone. Pt's son states he is agreeable for pt to go to SNF prior to returning to ALF. Pt's son states they did not have a specific facility choice however the son wants to research and states he will let CSW know this evening or in the AM. MD notified. CSW has sent referral to SNFs in [place] and surrounding area.           Expected Discharge Plan: Skilled Nursing Facility Barriers to Discharge: Continued Medical Work up   Patient Goals and CMS Choice Patient states their goals for this hospitalization and ongoing recovery are:: for pt to go to snf   Choice offered to / list presented to : Adult Children  Expected Discharge Plan and Services Expected Discharge Plan: Skilled Nursing Facility In-house Referral: Clinical Social Work   Post Acute Care Choice: Skilled Nursing Facility Living arrangements for the past 2 months: Assisted Living Facility                                      Prior Living Arrangements/Services Living arrangements for the past 2 months: Assisted Living Facility Lives with:: Facility Resident Patient language and need for interpreter reviewed:: Yes Do you feel safe going back to the place where you live?: Yes      Need for Family Participation in Patient Care: Yes (Comment) Care giver support system in place?: Yes (comment)   Criminal Activity/Legal Involvement Pertinent to Current Situation/Hospitalization: No - Comment as needed  Activities of Daily Living Home Assistive Devices/Equipment: Dan Humphreys (specify type) ADL Screening (condition at time of admission) Patient's cognitive ability adequate to safely complete daily activities?: No Is the patient  deaf or have difficulty hearing?: No Does the patient have difficulty seeing, even when wearing glasses/contacts?: No Does the patient have difficulty concentrating, remembering, or making decisions?: Yes Patient able to express need for assistance with ADLs?: Yes Does the patient have difficulty dressing or bathing?: Yes Independently performs ADLs?: No Communication: Independent Dressing (OT): Needs assistance Is this a change from baseline?: Change from baseline, expected to last <3days Grooming: Needs assistance Feeding: Needs assistance Bathing: Needs assistance Toileting: Needs assistance In/Out Bed: Needs assistance Walks in Home: Needs assistance Does the patient have difficulty walking or climbing stairs?: Yes Weakness of Legs: Both Weakness of Arms/Hands: Both  Permission Sought/Granted Permission sought to share information with : Family Supports    Share Information with NAME: Freida Busman     Permission granted to share info w Relationship: son     Emotional Assessment Appearance:: Appears stated age Attitude/Demeanor/Rapport: Unable to Assess Affect (typically observed): Unable to Assess Orientation: : Oriented to Self Alcohol / Substance Use: Not Applicable Psych Involvement: No (comment)  Admission diagnosis:  Stroke [facility]) [I63.9] Patient Active Problem List   Diagnosis Date Noted  . Left arm weakness 09/21/2020  . Dementia (HCC) 09/21/2020  . Stroke (HCC) 09/21/2020  . Diabetes mellitus without complication (HCC)   . Hypertension   . GERD (gastroesophageal reflux disease)   . Depression   . Thrombocytopenia (HCC)   . ICH (intracerebral hemorrhage) (HCC) 09/30/2017  .  DIZZINESS 02/23/2010  . DYSPNEA 02/23/2010  . ABNORMAL ELECTROCARDIOGRAM 02/23/2010   PCP:  Barbette Reichmann, MD Pharmacy:   [facility], [place] - [address] [place] [address] Phone: [phone] Fax: [phone]     Social Determinants of Health  (SDOH) Interventions    Readmission Risk Interventions No flowsheet data found.

## 2020-12-16 DIAGNOSIS — E119 Type 2 diabetes mellitus without complications: Secondary | ICD-10-CM | POA: Diagnosis not present

## 2020-12-16 DIAGNOSIS — F0150 Vascular dementia without behavioral disturbance: Secondary | ICD-10-CM | POA: Diagnosis not present

## 2020-12-16 LAB — BASIC METABOLIC PANEL
Anion gap: 6 (ref 5–15)
BUN: 29 mg/dL — ABNORMAL HIGH (ref 8–23)
CO2: 25 mmol/L (ref 22–32)
Calcium: 8.5 mg/dL — ABNORMAL LOW (ref 8.9–10.3)
Chloride: 109 mmol/L (ref 98–111)
Creatinine, Ser: 1.62 mg/dL — ABNORMAL HIGH (ref 0.61–1.24)
GFR, Estimated: 40 mL/min — ABNORMAL LOW (ref >60)
Glucose, Bld: 121 mg/dL — ABNORMAL HIGH (ref 70–99)
Potassium: 4 mmol/L (ref 3.5–5.1)
Sodium: 140 mmol/L (ref 135–145)

## 2020-12-16 LAB — GLUCOSE, CAPILLARY
Glucose-Capillary: 111 mg/dL — ABNORMAL HIGH (ref 70–99)
Glucose-Capillary: 111 mg/dL — ABNORMAL HIGH (ref 70–99)
Glucose-Capillary: 121 mg/dL — ABNORMAL HIGH (ref 70–99)
Glucose-Capillary: 204 mg/dL — ABNORMAL HIGH (ref 70–99)

## 2020-12-16 NOTE — Progress Notes (Signed)
PT Cancellation Note  Patient Details Name: GOHAN COLLISTER MRN: [id number] DOB: [birth year]   Cancelled Treatment:     PT attempt. PT hold per family request. Pt's family is planning to DC to SNF later this date and pt has been extremely lethargic. Per family request, hold PT. If pt does not DC to SNF will return tomorrow and continue to follow per current POC.    Rushie Chestnut 12/16/2020, 4:10 PM

## 2020-12-16 NOTE — Progress Notes (Signed)
[facility]                                                                              Patient Demographics  Paul Martinez, is a 87 y.o. male, DOB - Jan 20, 1930, DGL:[id number]  Admit date - 12/13/2020   Admitting Physician Pieter Partridge, MD  Outpatient Primary MD for the patient is Barbette Reichmann, MD  Outpatient specialists:   LOS - 3  days   Medical records reviewed and are as summarized below:    Chief Complaint  Patient presents with  . Fall  . Altered Mental Status       Brief summary   Patient is a 87 year old male with history of dementia, lives in ALF, hypertension, diabetes mellitus type 2, GERD, history of ICH, thrombocytopenia and recent history of stroke in February 2022, was in his usual state of health until yesterday when he was found on the ground next to the toilet.  He had not lost consciousness and at that time he stated he was unable to pull his pants up and fell onto his side when he was trying to do that.  Patient was assisted back to the bed by staff.  It was unclear how long he had been on the floor.    On the morning of admission, when nurses aide went to check on him, patient was unable to get up out of bed and into his chair.  He was unable to even just stand up. Patient was brought to ED, underwent CT scan which showed new stroke in the left caudate nucleus possibly extended into the internal capsule. Patient was admitted due to acute CVA  Assessment & Plan    Principal Problem: Acute left caudate nucleus CVA -In the setting of recent right MCA CVA in 09/2020.  Patient was placed on aspirin, Plavix and atorvastatin in 09/2020. -Neuro consulted.  MRI brain showed acute infarct left anterior basal ganglia, atrophy and extensive chronic ischemic changes -Recent stroke work have been completed, including echo in 09/2020 showed EF of 60 to 65% grade 3 DD -PT evaluation recommended SNF -LDL 55, continue statin -Neurology  recommended interrogation of loop recorder.  Per cardiology, no A. fib noted -Continue dual antiplatelet agents for 21 days -Per son's request, palliative consulted and recommendations appreciated DNR/DNI -SLP evaluation recommended dysphagia 3 diet with thin liquids -Awaiting skilled nursing facility bed  Active Problems: Acute kidney injury  -Creatinine 1.7 in 2/22, presented with creatinine of 1.93 on admission -Creatinine improved to 1.6, DC IV fluids    Diabetes mellitus type II, not on long-term insulin, with hyperlipidemia, stroke -Hold metformin, glipizide -CBG stable, 111, 121 -Continue sliding scale insulin while inpatient    Hypertension -Currently stable, hold lisinopril    Dementia (HCC) Continue Zoloft, Wellbutrin, Aricept   Obesity  Estimated body mass index is 29.61 kg/m as calculated from the following:   Height as of this encounter: 5\' 11"  (1.803 m).   Weight as of this encounter: 96.3 kg.  Code Status DNR DVT Prophylaxis:  SCD's Start: 12/13/20 1654   Level of Care: Level of care: Progressive Cardiac Family Communication: Discussed all imaging  results, lab results, explained to the patient and son at bedside yesterday   Disposition Plan:     Status is: Inpatient  Remains inpatient appropriate because:Inpatient level of care appropriate due to severity of illness   Dispo: The patient is from: ALF              Anticipated d/c is to: SNF              Patient currently is medically stable to d/c.  Awaiting SNF authorization and bed   Difficult to place patient No      Time Spent in minutes 25 minutes  Procedures:  MRI brain Loop recorder interrogation  Consultants:   Neurology Palliative medicine  Antimicrobials:   Anti-infectives (From admission, onward)   None         Medications  Scheduled Meds: . aspirin EC  81 mg Oral Daily  . atorvastatin  40 mg Oral Daily  . buPROPion  150 mg Oral Daily  . clopidogrel  75 mg Oral Daily   . donepezil  10 mg Oral QHS  . feeding supplement  237 mL Oral TID BM  . insulin aspart  0-9 Units Subcutaneous TID WC  . multivitamin with minerals  1 tablet Oral Daily  . pantoprazole  20 mg Oral Daily  . sertraline  100 mg Oral Daily   Continuous Infusions: . sodium chloride 75 mL/hr at 12/15/20 1553   PRN Meds:.acetaminophen **OR** acetaminophen (TYLENOL) oral liquid 160 mg/5 mL **OR** acetaminophen      Subjective:   Durante Violett was seen and examined today.  Somewhat sleepy today but easily arousable.  No complaints.  No acute events overnight.  Awaiting SNF  Objective:   Vitals:   12/15/20 2038 12/16/20 0444 12/16/20 0841 12/16/20 1227  BP: 139/63 (!) 149/71 (!) 171/85 (!) 174/73  Pulse: 64 (!) 58 62 62  Resp: Temp: 98 F (36.7 C) 97.8 F (36.6 C) 97.9 F (36.6 C) 97.8 F (36.6 C)  TempSrc:    Oral  SpO2: 96% 98% 99% 99%  Weight:  96.3 kg    Height:        Intake/Output Summary (Last 24 hours) at 12/16/2020 1240 Last data filed at 12/16/2020 0843 Gross per 24 hour  Intake 240 ml  Output 1500 ml  Net -1260 ml     Wt Readings from Last 3 Encounters:  12/16/20 96.3 kg  11/30/20 90.7 kg  11/07/20 98.4 kg    Physical Exam  General: Somewhat sleepy today, but arousable  Cardiovascular: S1 S2 clear, RRR. No pedal edema b/l  Respiratory: Diminished breath sound at bases  Gastrointestinal: Soft, nontender, nondistended, NBS  Ext: no pedal edema bilaterally  Neuro: no neurodeficits today  Musculoskeletal: No cyanosis, clubbing  Skin: No rashes  Psych: dementia    Data Reviewed:  I have personally reviewed following labs and imaging studies  Micro Results Recent Results (from the past 240 hour(s))  Resp Panel by RT-PCR (Flu A&B, Covid) Nasopharyngeal Swab     Status: None   Collection Time: 12/13/20  1:25 PM   Specimen: Nasopharyngeal Swab; Nasopharyngeal(NP) swabs in vial transport medium  Result Value Ref Range Status   SARS  Coronavirus 2 by RT PCR NEGATIVE NEGATIVE Final    Comment: (NOTE) SARS-CoV-2 target nucleic acids are NOT DETECTED.  The SARS-CoV-2 RNA is generally detectable in upper respiratory specimens during the acute phase of infection. The lowest concentration of SARS-CoV-2 viral copies this assay  can detect is 138 copies/mL. A negative result does not preclude SARS-Cov-2 infection and should not be used as the sole basis for treatment or other patient management decisions. A negative result may occur with  improper specimen collection/handling, submission of specimen other than nasopharyngeal swab, presence of viral mutation(s) within the areas targeted by this assay, and inadequate number of viral copies(<138 copies/mL). A negative result must be combined with clinical observations, patient history, and epidemiological information. The expected result is Negative.  Fact Sheet for Patients:  BloggerCourse.comhttps://www.fda.gov/media/152166/download  Fact Sheet for Healthcare Providers:  SeriousBroker.ithttps://www.fda.gov/media/152162/download  This test is no t yet approved or cleared by the [place] FDA and  has been authorized for detection and/or diagnosis of SARS-CoV-2 by FDA under an Emergency Use Authorization (EUA). This EUA will remain  in effect (meaning this test can be used) for the duration of the COVID-19 declaration under Section 564(b)(1) of the Act, 21 U.S.C.section 360bbb-3(b)(1), unless the authorization is terminated  or revoked sooner.       Influenza A by PCR NEGATIVE NEGATIVE Final   Influenza B by PCR NEGATIVE NEGATIVE Final    Comment: (NOTE) The Xpert Xpress SARS-CoV-2/FLU/RSV plus assay is intended as an aid in the diagnosis of influenza from Nasopharyngeal swab specimens and should not be used as a sole basis for treatment. Nasal washings and aspirates are unacceptable for Xpert Xpress SARS-CoV-2/FLU/RSV testing.  Fact Sheet for  Patients: BloggerCourse.comhttps://www.fda.gov/media/152166/download  Fact Sheet for Healthcare Providers: SeriousBroker.ithttps://www.fda.gov/media/152162/download  This test is not yet approved or cleared by the [place] FDA and has been authorized for detection and/or diagnosis of SARS-CoV-2 by FDA under an Emergency Use Authorization (EUA). This EUA will remain in effect (meaning this test can be used) for the duration of the COVID-19 declaration under Section 564(b)(1) of the Act, 21 U.S.C. section 360bbb-3(b)(1), unless the authorization is terminated or revoked.  Performed at [facility], [address] [place], [place] [address]   Resp Panel by RT-PCR (Flu A&B, Covid) Nasopharyngeal Swab     Status: None   Collection Time: 12/15/20  1:16 PM   Specimen: Nasopharyngeal Swab; Nasopharyngeal(NP) swabs in vial transport medium  Result Value Ref Range Status   SARS Coronavirus 2 by RT PCR NEGATIVE NEGATIVE Final    Comment: (NOTE) SARS-CoV-2 target nucleic acids are NOT DETECTED.  The SARS-CoV-2 RNA is generally detectable in upper respiratory specimens during the acute phase of infection. The lowest concentration of SARS-CoV-2 viral copies this assay can detect is 138 copies/mL. A negative result does not preclude SARS-Cov-2 infection and should not be used as the sole basis for treatment or other patient management decisions. A negative result may occur with  improper specimen collection/handling, submission of specimen other than nasopharyngeal swab, presence of viral mutation(s) within the areas targeted by this assay, and inadequate number of viral copies(<138 copies/mL). A negative result must be combined with clinical observations, patient history, and epidemiological information. The expected result is Negative.  Fact Sheet for Patients:  BloggerCourse.comhttps://www.fda.gov/media/152166/download  Fact Sheet for Healthcare Providers:  SeriousBroker.ithttps://www.fda.gov/media/152162/download  This test is no t  yet approved or cleared by the [place] FDA and  has been authorized for detection and/or diagnosis of SARS-CoV-2 by FDA under an Emergency Use Authorization (EUA). This EUA will remain  in effect (meaning this test can be used) for the duration of the COVID-19 declaration under Section 564(b)(1) of the Act, 21 U.S.C.section 360bbb-3(b)(1), unless the authorization is terminated  or revoked sooner.  Influenza A by PCR NEGATIVE NEGATIVE Final   Influenza B by PCR NEGATIVE NEGATIVE Final    Comment: (NOTE) The Xpert Xpress SARS-CoV-2/FLU/RSV plus assay is intended as an aid in the diagnosis of influenza from Nasopharyngeal swab specimens and should not be used as a sole basis for treatment. Nasal washings and aspirates are unacceptable for Xpert Xpress SARS-CoV-2/FLU/RSV testing.  Fact Sheet for Patients: BloggerCourse.com  Fact Sheet for Healthcare Providers: SeriousBroker.it  This test is not yet approved or cleared by the [place] FDA and has been authorized for detection and/or diagnosis of SARS-CoV-2 by FDA under an Emergency Use Authorization (EUA). This EUA will remain in effect (meaning this test can be used) for the duration of the COVID-19 declaration under Section 564(b)(1) of the Act, 21 U.S.C. section 360bbb-3(b)(1), unless the authorization is terminated or revoked.  Performed at [facility], [address], [place], [place] [address]     Radiology Reports CT Head Wo Contrast  Result Date: 12/13/2020 CLINICAL DATA:  Unwitnessed fall. Found on floor this morning. Mental status change. EXAM: CT HEAD WITHOUT CONTRAST TECHNIQUE: Contiguous axial images were obtained from the base of the skull through the vertex without intravenous contrast. COMPARISON:  10/04/2020 FINDINGS: Brain: There is hypoattenuation involving a significant portion of the left caudate nucleus head, new since the prior CT.  This is consistent with a recent infarct. No other evidence of a recent infarct. Small area old infarction involving the left occipital lobe, with another area involving the left cerebellum, both stable. Age related ventricular and sulcal enlargement. No hydrocephalus. Bilateral patchy white hypoattenuation is noted consistent with advanced chronic microvascular ischemic change, stable. There are no parenchymal masses or mass effect. No extra-axial masses or abnormal fluid collections. No intracranial hemorrhage. Vascular: No hyperdense vessel or unexpected calcification. Skull: Normal. Negative for fracture or focal lesion. Sinuses/Orbits: Globes and orbits are unremarkable. Visualized sinuses are clear. Other: None. IMPRESSION: 1. Infarct involving a significant portion of the left caudate nucleus head, possibly extending to the genu and anterior limb of the left internal capsule. This is new since the prior head CT and is suspected to be recent. Further assessment, to establish chronicity, with unenhanced brain MRI is suggested. 2. No other evidence of a recent infarct. 3. No intracranial hemorrhage. 4. Chronic changes of advanced chronic microvascular ischemic change as well as old left occipital and left cerebellar infarcts. Electronically Signed   By: Amie Portland M.D.   On: 12/13/2020 14:32   MR BRAIN WO CONTRAST  Result Date: 12/13/2020 CLINICAL DATA:  Acute neuro deficit.  Stroke. EXAM: MRI HEAD WITHOUT CONTRAST TECHNIQUE: Multiplanar, multiecho pulse sequences of the brain and surrounding structures were obtained without intravenous contrast. COMPARISON:  CT head 12/13/2020.  MRI head 09/21/2020 FINDINGS: Brain: Acute infarct in the left basal ganglia involving the head of caudate, anterior limb internal capsule, and anterior putamen. No other acute infarct. Resolving small acute infarct in the right parietal lobe which shows restricted diffusion and February 2022. Moderate atrophy. Extensive chronic  microvascular ischemic change throughout the white matter bilaterally. Chronic infarcts in the basal ganglia bilaterally, chronic infarct in the right midbrain, and chronic infarcts in the cerebellum bilaterally. Chronic infarct occipital pole bilaterally left greater than right. Negative for hemorrhage or mass. Vascular: Abnormal flow void in the distal right vertebral artery which may be occluded or have slow flow. This is unchanged. Otherwise normal arterial flow voids. Skull and upper cervical spine: Negative Sinuses/Orbits: Paranasal sinuses clear. Bilateral cataract  extraction Other: None IMPRESSION: Acute infarct left anterior basal ganglia. Atrophy and extensive chronic ischemic change. Electronically Signed   By: Marlan Palau M.D.   On: 12/13/2020 16:21   DG Chest Portable 1 View  Result Date: 12/13/2020 CLINICAL DATA:  Fall today.  Weakness.  Altered mental status. EXAM: PORTABLE CHEST 1 VIEW COMPARISON:  09/06/2013 FINDINGS: Cardiac silhouette is top-normal in size. No mediastinal or hilar masses. Clear lungs.  No convincing pleural effusion.  No pneumothorax. Skeletal structures are grossly intact. IMPRESSION: No active disease. Electronically Signed   By: Amie Portland M.D.   On: 12/13/2020 14:25    Lab Data:  CBC: Recent Labs  Lab 12/13/20 1325 12/15/20 0400  WBC 8.7 7.4  HGB 12.1* 11.8*  HCT 36.4* 35.8*  MCV 95.0 94.7  PLT 101* PLATELET CLUMPS NOTED ON SMEAR, UNABLE TO ESTIMATE   Basic Metabolic Panel: Recent Labs  Lab 12/13/20 1325 12/15/20 0400 12/16/20 0449  NA 142 140 140  K 4.2 4.1 4.0  CL 110 112* 109  CO2 23 22 25   GLUCOSE 111* 100* 121*  BUN 24* 24* 29*  CREATININE 1.93* 1.81* 1.62*  CALCIUM 9.1 8.7* 8.5*   GFR: Estimated Creatinine Clearance: 36.6 mL/min (A) (by C-G formula based on SCr of 1.62 mg/dL (H)). Liver Function Tests: Recent Labs  Lab 12/13/20 1325  AST 31  ALT 21  ALKPHOS 101  BILITOT 1.3*  PROT 6.4*  ALBUMIN 3.5   No results for  input(s): LIPASE, AMYLASE in the last 168 hours. No results for input(s): AMMONIA in the last 168 hours. Coagulation Profile: No results for input(s): INR, PROTIME in the last 168 hours. Cardiac Enzymes: Recent Labs  Lab 12/13/20 1714 12/14/20 0359 12/14/20 1658  CKTOTAL 67 171 566*   BNP (last 3 results) No results for input(s): PROBNP in the last 8760 hours. HbA1C: Recent Labs    12/13/20 1714 12/14/20 0359  HGBA1C 5.8* 5.7*   CBG: Recent Labs  Lab 12/15/20 1148 12/15/20 1610 12/15/20 2038 12/16/20 0840 12/16/20 1228  GLUCAP 233* 164* 264* 111* 111*   Lipid Profile: Recent Labs    12/14/20 0359  CHOL 115  HDL 42  LDLCALC 55  TRIG 88  CHOLHDL 2.7   Thyroid Function Tests: No results for input(s): TSH, T4TOTAL, FREET4, T3FREE, THYROIDAB in the last 72 hours. Anemia Panel: No results for input(s): VITAMINB12, FOLATE, FERRITIN, TIBC, IRON, RETICCTPCT in the last 72 hours. Urine analysis:    Component Value Date/Time   COLORURINE YELLOW (A) 10/04/2020 1837   APPEARANCEUR CLEAR (A) 10/04/2020 1837   APPEARANCEUR Clear 09/06/2013 0739   LABSPEC 1.018 10/04/2020 1837   LABSPEC 1.020 09/06/2013 0739   PHURINE 5.0 10/04/2020 1837   GLUCOSEU NEGATIVE 10/04/2020 1837   GLUCOSEU Negative 09/06/2013 0739   HGBUR SMALL (A) 10/04/2020 1837   BILIRUBINUR NEGATIVE 10/04/2020 1837   BILIRUBINUR Negative 09/06/2013 0739   KETONESUR NEGATIVE 10/04/2020 1837   PROTEINUR NEGATIVE 10/04/2020 1837   NITRITE NEGATIVE 10/04/2020 1837   LEUKOCYTESUR SMALL (A) 10/04/2020 1837   LEUKOCYTESUR Negative 09/06/2013 0739     Masyn Fullam M.D. [facility] 12/16/2020, 12:40 PM  Available via Epic secure chat 7am-7pm After 7 pm, please refer to night coverage provider listed on amion.

## 2020-12-16 NOTE — TOC Progression Note (Signed)
Transition of Care [facility]) - Progression Note    Patient Details  Name: QUSAI KEM MRN: [id number] Date of Birth: 11-11-1929  Transition [facility]) CM/SW Contact  Hetty Ely, RN Phone Number: 12/16/2020, 12:31 PM  Clinical Narrative:  Received approval for [facility] admission Coordinator Essie Hart. Ship broker submitted, Attending and RN notified.     Expected Discharge Plan: Skilled Nursing Facility Barriers to Discharge: Continued Medical Work up  Expected Discharge Plan and Services Expected Discharge Plan: Skilled Nursing Facility In-house Referral: Clinical Social Work   Post Acute Care Choice: Skilled Nursing Facility Living arrangements for the past 2 months: Assisted Living Facility                                       Social Determinants of Health (SDOH) Interventions    Readmission Risk Interventions No flowsheet data found.

## 2020-12-17 DIAGNOSIS — F0150 Vascular dementia without behavioral disturbance: Secondary | ICD-10-CM | POA: Diagnosis not present

## 2020-12-17 DIAGNOSIS — I10 Essential (primary) hypertension: Secondary | ICD-10-CM | POA: Diagnosis not present

## 2020-12-17 LAB — BASIC METABOLIC PANEL
Anion gap: 6 (ref 5–15)
BUN: 26 mg/dL — ABNORMAL HIGH (ref 8–23)
CO2: 24 mmol/L (ref 22–32)
Calcium: 8.4 mg/dL — ABNORMAL LOW (ref 8.9–10.3)
Chloride: 107 mmol/L (ref 98–111)
Creatinine, Ser: 1.25 mg/dL — ABNORMAL HIGH (ref 0.61–1.24)
GFR, Estimated: 55 mL/min — ABNORMAL LOW (ref >60)
Glucose, Bld: 97 mg/dL (ref 70–99)
Potassium: 4 mmol/L (ref 3.5–5.1)
Sodium: 137 mmol/L (ref 135–145)

## 2020-12-17 LAB — GLUCOSE, CAPILLARY
Glucose-Capillary: 102 mg/dL — ABNORMAL HIGH (ref 70–99)
Glucose-Capillary: 133 mg/dL — ABNORMAL HIGH (ref 70–99)
Glucose-Capillary: 131 mg/dL — ABNORMAL HIGH (ref 70–99)
Glucose-Capillary: 228 mg/dL — ABNORMAL HIGH (ref 70–99)

## 2020-12-17 NOTE — Progress Notes (Signed)
[facility]                                                                              Patient Demographics  Paul Martinez, is a 87 y.o. male, DOB - [birth year], XBD:[id number]  Admit date - 12/13/2020   Admitting Physician Pieter Partridge, MD  Outpatient Primary MD for the patient is Barbette Reichmann, MD  Outpatient specialists:   LOS - 4  days   Medical records reviewed and are as summarized below:    Chief Complaint  Patient presents with  . Fall  . Altered Mental Status       Brief summary   Patient is a 87 year old male with history of dementia, lives in ALF, hypertension, diabetes mellitus type 2, GERD, history of ICH, thrombocytopenia and recent history of stroke in February 2022, was in his usual state of health until yesterday when he was found on the ground next to the toilet.  He had not lost consciousness and at that time he stated he was unable to pull his pants up and fell onto his side when he was trying to do that.  Patient was assisted back to the bed by staff.  It was unclear how long he had been on the floor.    On the morning of admission, when nurses aide went to check on him, patient was unable to get up out of bed and into his chair.  He was unable to even just stand up. Patient was brought to ED, underwent CT scan which showed new stroke in the left caudate nucleus possibly extended into the internal capsule. Patient was admitted due to acute CVA  Assessment & Plan    Principal Problem: Acute left caudate nucleus CVA -In the setting of recent right MCA CVA in 09/2020.  Patient was placed on aspirin, Plavix and atorvastatin in 09/2020. -Neuro consulted.  MRI brain showed acute infarct left anterior basal ganglia, atrophy and extensive chronic ischemic changes -Recent stroke work have been completed,echo in 09/2020 showed EF of 60 to 65% grade 3 DD -PT evaluation recommended SNF -LDL 55, continue statin -Neurology recommended  interrogation of loop recorder.  Per cardiology, no A. fib noted -Continue dual antiplatelet agents for 21 days -Per son's request, palliative consulted and recommendations appreciated DNR/DNI -SLP evaluation recommended dysphagia 3 diet with thin liquids - currently awaiting skilled nursing facility bed, likely on Monday  Active Problems: Acute kidney injury  -Creatinine 1.7 in 2/22, presented with creatinine of 1.93 on admission -Improved, creatinine 1.2    Diabetes mellitus type II, not on long-term insulin, with hyperlipidemia, stroke -Hold metformin, glipizide -Continue sliding scale insulin while inpatient.  CBGs controlled, fasting monitor    Hypertension -BP stable, continue to hold lisinopril    Dementia (HCC) Continue Zoloft, Wellbutrin, Aricept   Obesity  Estimated body mass index is 30.4 kg/m as calculated from the following:   Height as of this encounter: 5\' 11"  (1.803 m).   Weight as of this encounter: 98.9 kg.  Code Status DNR DVT Prophylaxis:  SCD's Start: 12/13/20 1654   Level of Care: Level of care: Progressive Cardiac Family Communication: No  family at bedside currently.  Awaiting skilled nursing facility  Disposition Plan:     Status is: Inpatient  Remains inpatient appropriate because:Inpatient level of care appropriate due to severity of illness   Dispo: The patient is from: ALF              Anticipated d/c is to: SNF              Patient currently is medically stable to d/c.  Awaiting SNF authorization and bed   Difficult to place patient No      Time Spent in minutes 25 minutes  Procedures:  MRI brain Loop recorder interrogation  Consultants:   Neurology Palliative medicine  Antimicrobials:   Anti-infectives (From admission, onward)   None         Medications  Scheduled Meds: . aspirin EC  81 mg Oral Daily  . atorvastatin  40 mg Oral Daily  . buPROPion  150 mg Oral Daily  . clopidogrel  75 mg Oral Daily  . donepezil   10 mg Oral QHS  . feeding supplement  237 mL Oral TID BM  . insulin aspart  0-9 Units Subcutaneous TID WC  . multivitamin with minerals  1 tablet Oral Daily  . pantoprazole  20 mg Oral Daily  . sertraline  100 mg Oral Daily   Continuous Infusions:  PRN Meds:.acetaminophen **OR** acetaminophen (TYLENOL) oral liquid 160 mg/5 mL **OR** acetaminophen      Subjective:   Paul Martinez was seen and examined today.  No acute complaints alert and oriented to self, and appears to be at baseline.  No acute events overnight.  Does not appear to be in any pain.  Objective:   Vitals:   12/16/20 2022 12/17/20 0322 12/17/20 0739 12/17/20 1132  BP: 127/64 132/69 134/61 124/61  Pulse: 65 61 62 64  Resp: Temp: 98.6 F (37 C) 98.6 F (37 C) 98.8 F (37.1 C) 98.5 F (36.9 C)  TempSrc:   Oral Oral  SpO2: 98% 94% 95% 94%  Weight:  98.9 kg    Height:        Intake/Output Summary (Last 24 hours) at 12/17/2020 1352 Last data filed at 12/17/2020 1231 Gross per 24 hour  Intake --  Output 1575 ml  Net -1575 ml     Wt Readings from Last 3 Encounters:  12/17/20 98.9 kg  11/30/20 90.7 kg  11/07/20 98.4 kg   Physical Exam  General: Alert and oriented x self  NAD, comfortable  Cardiovascular: S1 S2 clear, RRR. No pedal edema b/l  Respiratory: CTAB  Gastrointestinal: Soft, nontender, nondistended, NBS  Ext: no pedal edema bilaterally  Neuro: no new deficits  Musculoskeletal: No cyanosis, clubbing  Skin: No rashes  Psych: dementia   Data Reviewed:  I have personally reviewed following labs and imaging studies  Micro Results Recent Results (from the past 240 hour(s))  Resp Panel by RT-PCR (Flu A&B, Covid) Nasopharyngeal Swab     Status: None   Collection Time: 12/13/20  1:25 PM   Specimen: Nasopharyngeal Swab; Nasopharyngeal(NP) swabs in vial transport medium  Result Value Ref Range Status   SARS Coronavirus 2 by RT PCR NEGATIVE NEGATIVE Final    Comment:  (NOTE) SARS-CoV-2 target nucleic acids are NOT DETECTED.  The SARS-CoV-2 RNA is generally detectable in upper respiratory specimens during the acute phase of infection. The lowest concentration of SARS-CoV-2 viral copies this assay can detect is 138 copies/mL. A negative result does not  preclude SARS-Cov-2 infection and should not be used as the sole basis for treatment or other patient management decisions. A negative result may occur with  improper specimen collection/handling, submission of specimen other than nasopharyngeal swab, presence of viral mutation(s) within the areas targeted by this assay, and inadequate number of viral copies(<138 copies/mL). A negative result must be combined with clinical observations, patient history, and epidemiological information. The expected result is Negative.  Fact Sheet for Patients:  BloggerCourse.com  Fact Sheet for Healthcare Providers:  SeriousBroker.it  This test is no t yet approved or cleared by the [place] FDA and  has been authorized for detection and/or diagnosis of SARS-CoV-2 by FDA under an Emergency Use Authorization (EUA). This EUA will remain  in effect (meaning this test can be used) for the duration of the COVID-19 declaration under Section 564(b)(1) of the Act, 21 U.S.C.section 360bbb-3(b)(1), unless the authorization is terminated  or revoked sooner.       Influenza A by PCR NEGATIVE NEGATIVE Final   Influenza B by PCR NEGATIVE NEGATIVE Final    Comment: (NOTE) The Xpert Xpress SARS-CoV-2/FLU/RSV plus assay is intended as an aid in the diagnosis of influenza from Nasopharyngeal swab specimens and should not be used as a sole basis for treatment. Nasal washings and aspirates are unacceptable for Xpert Xpress SARS-CoV-2/FLU/RSV testing.  Fact Sheet for Patients: BloggerCourse.com  Fact Sheet for Healthcare  Providers: SeriousBroker.it  This test is not yet approved or cleared by the [place] FDA and has been authorized for detection and/or diagnosis of SARS-CoV-2 by FDA under an Emergency Use Authorization (EUA). This EUA will remain in effect (meaning this test can be used) for the duration of the COVID-19 declaration under Section 564(b)(1) of the Act, 21 U.S.C. section 360bbb-3(b)(1), unless the authorization is terminated or revoked.  Performed at [facility], [address] [place], [place] [address]   Resp Panel by RT-PCR (Flu A&B, Covid) Nasopharyngeal Swab     Status: None   Collection Time: 12/15/20  1:16 PM   Specimen: Nasopharyngeal Swab; Nasopharyngeal(NP) swabs in vial transport medium  Result Value Ref Range Status   SARS Coronavirus 2 by RT PCR NEGATIVE NEGATIVE Final    Comment: (NOTE) SARS-CoV-2 target nucleic acids are NOT DETECTED.  The SARS-CoV-2 RNA is generally detectable in upper respiratory specimens during the acute phase of infection. The lowest concentration of SARS-CoV-2 viral copies this assay can detect is 138 copies/mL. A negative result does not preclude SARS-Cov-2 infection and should not be used as the sole basis for treatment or other patient management decisions. A negative result may occur with  improper specimen collection/handling, submission of specimen other than nasopharyngeal swab, presence of viral mutation(s) within the areas targeted by this assay, and inadequate number of viral copies(<138 copies/mL). A negative result must be combined with clinical observations, patient history, and epidemiological information. The expected result is Negative.  Fact Sheet for Patients:  BloggerCourse.com  Fact Sheet for Healthcare Providers:  SeriousBroker.it  This test is no t yet approved or cleared by the [place] FDA and  has been authorized for  detection and/or diagnosis of SARS-CoV-2 by FDA under an Emergency Use Authorization (EUA). This EUA will remain  in effect (meaning this test can be used) for the duration of the COVID-19 declaration under Section 564(b)(1) of the Act, 21 U.S.C.section 360bbb-3(b)(1), unless the authorization is terminated  or revoked sooner.       Influenza A by PCR NEGATIVE NEGATIVE Final  Influenza B by PCR NEGATIVE NEGATIVE Final    Comment: (NOTE) The Xpert Xpress SARS-CoV-2/FLU/RSV plus assay is intended as an aid in the diagnosis of influenza from Nasopharyngeal swab specimens and should not be used as a sole basis for treatment. Nasal washings and aspirates are unacceptable for Xpert Xpress SARS-CoV-2/FLU/RSV testing.  Fact Sheet for Patients: BloggerCourse.comhttps://www.fda.gov/media/152166/download  Fact Sheet for Healthcare Providers: SeriousBroker.ithttps://www.fda.gov/media/152162/download  This test is not yet approved or cleared by the [place] FDA and has been authorized for detection and/or diagnosis of SARS-CoV-2 by FDA under an Emergency Use Authorization (EUA). This EUA will remain in effect (meaning this test can be used) for the duration of the COVID-19 declaration under Section 564(b)(1) of the Act, 21 U.S.C. section 360bbb-3(b)(1), unless the authorization is terminated or revoked.  Performed at [facility], [address] [place], [place] [address]     Radiology Reports CT Head Wo Contrast  Result Date: 12/13/2020 CLINICAL DATA:  Unwitnessed fall. Found on floor this morning. Mental status change. EXAM: CT HEAD WITHOUT CONTRAST TECHNIQUE: Contiguous axial images were obtained from the base of the skull through the vertex without intravenous contrast. COMPARISON:  10/04/2020 FINDINGS: Brain: There is hypoattenuation involving a significant portion of the left caudate nucleus head, new since the prior CT. This is consistent with a recent infarct. No other evidence of a recent infarct.  Small area old infarction involving the left occipital lobe, with another area involving the left cerebellum, both stable. Age related ventricular and sulcal enlargement. No hydrocephalus. Bilateral patchy white hypoattenuation is noted consistent with advanced chronic microvascular ischemic change, stable. There are no parenchymal masses or mass effect. No extra-axial masses or abnormal fluid collections. No intracranial hemorrhage. Vascular: No hyperdense vessel or unexpected calcification. Skull: Normal. Negative for fracture or focal lesion. Sinuses/Orbits: Globes and orbits are unremarkable. Visualized sinuses are clear. Other: None. IMPRESSION: 1. Infarct involving a significant portion of the left caudate nucleus head, possibly extending to the genu and anterior limb of the left internal capsule. This is new since the prior head CT and is suspected to be recent. Further assessment, to establish chronicity, with unenhanced brain MRI is suggested. 2. No other evidence of a recent infarct. 3. No intracranial hemorrhage. 4. Chronic changes of advanced chronic microvascular ischemic change as well as old left occipital and left cerebellar infarcts. Electronically Signed   By: Amie Portlandavid  Ormond M.D.   On: 12/13/2020 14:32   MR BRAIN WO CONTRAST  Result Date: 12/13/2020 CLINICAL DATA:  Acute neuro deficit.  Stroke. EXAM: MRI HEAD WITHOUT CONTRAST TECHNIQUE: Multiplanar, multiecho pulse sequences of the brain and surrounding structures were obtained without intravenous contrast. COMPARISON:  CT head 12/13/2020.  MRI head 09/21/2020 FINDINGS: Brain: Acute infarct in the left basal ganglia involving the head of caudate, anterior limb internal capsule, and anterior putamen. No other acute infarct. Resolving small acute infarct in the right parietal lobe which shows restricted diffusion and February 2022. Moderate atrophy. Extensive chronic microvascular ischemic change throughout the white matter bilaterally. Chronic  infarcts in the basal ganglia bilaterally, chronic infarct in the right midbrain, and chronic infarcts in the cerebellum bilaterally. Chronic infarct occipital pole bilaterally left greater than right. Negative for hemorrhage or mass. Vascular: Abnormal flow void in the distal right vertebral artery which may be occluded or have slow flow. This is unchanged. Otherwise normal arterial flow voids. Skull and upper cervical spine: Negative Sinuses/Orbits: Paranasal sinuses clear. Bilateral cataract extraction Other: None IMPRESSION: Acute infarct left anterior basal  ganglia. Atrophy and extensive chronic ischemic change. Electronically Signed   By: Marlan Palau M.D.   On: 12/13/2020 16:21   DG Chest Portable 1 View  Result Date: 12/13/2020 CLINICAL DATA:  Fall today.  Weakness.  Altered mental status. EXAM: PORTABLE CHEST 1 VIEW COMPARISON:  09/06/2013 FINDINGS: Cardiac silhouette is top-normal in size. No mediastinal or hilar masses. Clear lungs.  No convincing pleural effusion.  No pneumothorax. Skeletal structures are grossly intact. IMPRESSION: No active disease. Electronically Signed   By: Amie Portland M.D.   On: 12/13/2020 14:25    Lab Data:  CBC: Recent Labs  Lab 12/13/20 1325 12/15/20 0400  WBC 8.7 7.4  HGB 12.1* 11.8*  HCT 36.4* 35.8*  MCV 95.0 94.7  PLT 101* PLATELET CLUMPS NOTED ON SMEAR, UNABLE TO ESTIMATE   Basic Metabolic Panel: Recent Labs  Lab 12/13/20 1325 12/15/20 0400 12/16/20 0449 12/17/20 0550  NA 142 140 140 137  K 4.2 4.1 4.0 4.0  CL 110 112* 109 107  CO2 23 22 25 24   GLUCOSE 111* 100* 121* 97  BUN 24* 24* 29* 26*  CREATININE 1.93* 1.81* 1.62* 1.25*  CALCIUM 9.1 8.7* 8.5* 8.4*   GFR: Estimated Creatinine Clearance: 48 mL/min (A) (by C-G formula based on SCr of 1.25 mg/dL (H)). Liver Function Tests: Recent Labs  Lab 12/13/20 1325  AST 31  ALT 21  ALKPHOS 101  BILITOT 1.3*  PROT 6.4*  ALBUMIN 3.5   No results for input(s): LIPASE, AMYLASE in the  last 168 hours. No results for input(s): AMMONIA in the last 168 hours. Coagulation Profile: No results for input(s): INR, PROTIME in the last 168 hours. Cardiac Enzymes: Recent Labs  Lab 12/13/20 1714 12/14/20 0359 12/14/20 1658  CKTOTAL 67 171 566*   BNP (last 3 results) No results for input(s): PROBNP in the last 8760 hours. HbA1C: No results for input(s): HGBA1C in the last 72 hours. CBG: Recent Labs  Lab 12/16/20 1228 12/16/20 1723 12/16/20 2024 12/17/20 0741 12/17/20 1136  GLUCAP 111* 121* 204* 102* 133*   Lipid Profile: No results for input(s): CHOL, HDL, LDLCALC, TRIG, CHOLHDL, LDLDIRECT in the last 72 hours. Thyroid Function Tests: No results for input(s): TSH, T4TOTAL, FREET4, T3FREE, THYROIDAB in the last 72 hours. Anemia Panel: No results for input(s): VITAMINB12, FOLATE, FERRITIN, TIBC, IRON, RETICCTPCT in the last 72 hours. Urine analysis:    Component Value Date/Time   COLORURINE YELLOW (A) 10/04/2020 1837   APPEARANCEUR CLEAR (A) 10/04/2020 1837   APPEARANCEUR Clear 09/06/2013 0739   LABSPEC 1.018 10/04/2020 1837   LABSPEC 1.020 09/06/2013 0739   PHURINE 5.0 10/04/2020 1837   GLUCOSEU NEGATIVE 10/04/2020 1837   GLUCOSEU Negative 09/06/2013 0739   HGBUR SMALL (A) 10/04/2020 1837   BILIRUBINUR NEGATIVE 10/04/2020 1837   BILIRUBINUR Negative 09/06/2013 0739   KETONESUR NEGATIVE 10/04/2020 1837   PROTEINUR NEGATIVE 10/04/2020 1837   NITRITE NEGATIVE 10/04/2020 1837   LEUKOCYTESUR SMALL (A) 10/04/2020 1837   LEUKOCYTESUR Negative 09/06/2013 0739     Ziad Maye M.D. [facility] 12/17/2020, 1:52 PM  Available via Epic secure chat 7am-7pm After 7 pm, please refer to night coverage provider listed on amion.

## 2020-12-17 NOTE — TOC Progression Note (Signed)
Transition of Care [facility]) - Progression Note    Patient Details  Name: LERON STOFFERS MRN: [id number] Date of Birth: 1929-11-12  Transition of Care [facility]) CM/SW Contact  Bing Quarry, RN Phone Number: 12/17/2020, 4:16 PM  Clinical Narrative:  Patient medically ready for discharge this afternoon. Message left with [facility] at [phone]. Noni Saupe RN CM    Expected Discharge Plan: Skilled Nursing Facility Barriers to Discharge: Continued Medical Work up  Expected Discharge Plan and Services Expected Discharge Plan: Skilled Nursing Facility In-house Referral: Clinical Social Work   Post Acute Care Choice: Skilled Nursing Facility Living arrangements for the past 2 months: Assisted Living Facility                                       Social Determinants of Health (SDOH) Interventions    Readmission Risk Interventions No flowsheet data found.

## 2020-12-17 NOTE — Progress Notes (Signed)
Physical Therapy Treatment Patient Details Name: MONTAVIUS SUBRAMANIAM MRN: [id number] DOB: [birth year] Today's Date: 12/17/2020    History of Present Illness GOKU HARB is an 87 y.o. male with PMH significant for dementia, lives in assisted living, HTN, DM 2, GERD, history of ICH and thrombocytopenia and recent history of stroke in February 2022 was in his usual state of health until yesterday when he was found on the ground next to the toilet. Patient with new left caudate CVA with possible extension into the internal capsule was recently discharged 3 months ago with right MCA stroke    PT Comments    Assisted nursing in care as pt inc and leaking from external cath.  Pt with max a for rolling.  Verbal and tactile cues for hand placement and participation.  Poor participation in care.  Falling asleep at end of session.  Further mobility deferred for pt and staff safety.   Follow Up Recommendations  SNF     Equipment Recommendations  None recommended by PT    Recommendations for Other Services       Precautions / Restrictions Precautions Precautions: Fall Restrictions Weight Bearing Restrictions: No    Mobility  Bed Mobility Overal bed mobility: Needs Assistance Bed Mobility: Rolling Rolling: Max assist              Transfers Overall transfer level: Needs assistance Equipment used: Rolling walker (2 wheeled) Transfers: Sit to/from Stand              Ambulation/Gait                 Stairs             Wheelchair Mobility    Modified Rankin (Stroke Patients Only)       Balance                                            Cognition Arousal/Alertness: Lethargic Behavior During Therapy: Flat affect Overall Cognitive Status: History of cognitive impairments - at baseline                                        Exercises Other Exercises Other Exercises: pt inc urine - external cath leaking.  care  provided.    General Comments        Pertinent Vitals/Pain Pain Assessment: No/denies pain    Home Living                      Prior Function            PT Goals (current goals can now be found in the care plan section) Progress towards PT goals: Not progressing toward goals - comment    Frequency    7X/week      PT Plan Current plan remains appropriate    Co-evaluation              AM-PAC PT "6 Clicks" Mobility   Outcome Measure  Help needed turning from your back to your side while in a flat bed without using bedrails?: A Lot Help needed moving from lying on your back to sitting on the side of a flat bed without using bedrails?: A Lot Help needed moving to and from a bed to a  chair (including a wheelchair)?: A Lot Help needed standing up from a chair using your arms (e.g., wheelchair or bedside chair)?: A Lot Help needed to walk in hospital room?: A Lot Help needed climbing 3-5 steps with a railing? : Total 6 Click Score: 11    End of Session   Activity Tolerance: Patient limited by lethargy Patient left: in bed;with call bell/phone within reach;with bed alarm set Nurse Communication: Mobility status PT Visit Diagnosis: Unsteadiness on feet (R26.81);Muscle weakness (generalized) (M62.81);History of falling (Z91.81);Difficulty in walking, not elsewhere classified (R26.2)     Time: 6063-0160 PT Time Calculation (min) (ACUTE ONLY): 18 min  Charges:  $Therapeutic Activity: 8-22 mins                    Danielle Dess, PTA 12/17/20, 12:31 PM

## 2020-12-18 DIAGNOSIS — F0150 Vascular dementia without behavioral disturbance: Secondary | ICD-10-CM | POA: Diagnosis not present

## 2020-12-18 DIAGNOSIS — E119 Type 2 diabetes mellitus without complications: Secondary | ICD-10-CM | POA: Diagnosis not present

## 2020-12-18 LAB — GLUCOSE, CAPILLARY
Glucose-Capillary: 115 mg/dL — ABNORMAL HIGH (ref 70–99)
Glucose-Capillary: 208 mg/dL — ABNORMAL HIGH (ref 70–99)
Glucose-Capillary: 129 mg/dL — ABNORMAL HIGH (ref 70–99)
Glucose-Capillary: 313 mg/dL — ABNORMAL HIGH (ref 70–99)

## 2020-12-18 NOTE — TOC Progression Note (Signed)
Transition of Care [facility]) - Progression Note    Patient Details  Name: Paul Martinez MRN: [id number] Date of Birth: July 13, 1930  [facility]) CM/SW Contact  Bing Quarry, RN Phone Number: 12/18/2020, 5:19 PM  Clinical Narrative:  SNF will accept back on Monday if medically stable.  Gabriel Cirri RN CM    Expected Discharge Plan: Skilled Nursing Facility Barriers to Discharge: Continued Medical Work up  Expected Discharge Plan and Services Expected Discharge Plan: Skilled Nursing Facility In-house Referral: Clinical Social Work   Post Acute Care Choice: Skilled Nursing Facility Living arrangements for the past 2 months: Assisted Living Facility                                       Social Determinants of Health (SDOH) Interventions    Readmission Risk Interventions No flowsheet data found.

## 2020-12-18 NOTE — Progress Notes (Signed)
Physical Therapy Treatment Patient Details Name: Paul Martinez MRN: [id number] DOB: October 11, 1929 Today's Date: 12/18/2020    History of Present Illness Paul Martinez is an 87 y.o. male with PMH significant for dementia, lives in assisted living, HTN, DM 2, GERD, history of ICH and thrombocytopenia and recent history of stroke in February 2022 was in his usual state of health until yesterday when he was found on the ground next to the toilet. Patient with new left caudate CVA with possible extension into the internal capsule was recently discharged 3 months ago with right MCA stroke    PT Comments    Pt awake, family in room.  To EOB with mod a x 1 and cues/time to participate in care.  Pt was able to sit x 5 minutes with varied assist letters from min guard to mod assist.  Leans to right when fatigued.  Progressively more tired with increased time.  He does try to self initiate standing but is unable to clear hips.     Follow Up Recommendations  SNF     Equipment Recommendations  None recommended by PT    Recommendations for Other Services       Precautions / Restrictions Precautions Precautions: Fall Restrictions Weight Bearing Restrictions: No    Mobility  Bed Mobility Overal bed mobility: Needs Assistance Bed Mobility: Supine to Sit;Sit to Supine     Supine to sit: Mod assist Sit to supine: Max assist        Transfers                    Ambulation/Gait                 Stairs             Wheelchair Mobility    Modified Rankin (Stroke Patients Only)       Balance Overall balance assessment: Needs assistance Sitting-balance support: Feet supported;Bilateral upper extremity supported Sitting balance-Leahy Scale: Poor Sitting balance - Comments: R lateral lean  - able to hold balance briefly but mostly needs min/mod assist due to fatigue                                    Cognition Arousal/Alertness:  Awake/alert Behavior During Therapy: WFL for tasks assessed/performed Overall Cognitive Status: History of cognitive impairments - at baseline                                        Exercises      General Comments        Pertinent Vitals/Pain Pain Assessment: No/denies pain    Home Living                      Prior Function            PT Goals (current goals can now be found in the care plan section) Progress towards PT goals: Progressing toward goals    Frequency    7X/week      PT Plan Current plan remains appropriate    Co-evaluation              AM-PAC PT "6 Clicks" Mobility   Outcome Measure  Help needed turning from your back to your side while in a flat bed without using bedrails?:  A Lot Help needed moving from lying on your back to sitting on the side of a flat bed without using bedrails?: A Lot Help needed moving to and from a bed to a chair (including a wheelchair)?: Total Help needed standing up from a chair using your arms (e.g., wheelchair or bedside chair)?: Total Help needed to walk in hospital room?: Total Help needed climbing 3-5 steps with a railing? : Total 6 Click Score: 8    End of Session Equipment Utilized During Treatment: Gait belt Activity Tolerance: Patient tolerated treatment well Patient left: in bed;with call bell/phone within reach;with bed alarm set;with family/visitor present Nurse Communication: Mobility status PT Visit Diagnosis: Unsteadiness on feet (R26.81);Muscle weakness (generalized) (M62.81);History of falling (Z91.81);Difficulty in walking, not elsewhere classified (R26.2)     Time: 8250-5397 PT Time Calculation (min) (ACUTE ONLY): 14 min  Charges:  $Therapeutic Activity: 8-22 mins                    Danielle Dess, PTA 12/18/20, 1:02 PM

## 2020-12-18 NOTE — Progress Notes (Signed)
[facility]                                                                              Patient Demographics  Paul Martinez, is a 87 y.o. male, DOB - Jan 24, 1930, SHF:[id number]  Admit date - 12/13/2020   Admitting Physician Pieter Partridge, MD  Outpatient Primary MD for the patient is Barbette Reichmann, MD  Outpatient specialists:   LOS - 5  days   Medical records reviewed and are as summarized below:    Chief Complaint  Patient presents with  . Fall  . Altered Mental Status       Brief summary   Patient is a 87 year old male with history of dementia, lives in ALF, hypertension, diabetes mellitus type 2, GERD, history of ICH, thrombocytopenia and recent history of stroke in February 2022, was in his usual state of health until yesterday when he was found on the ground next to the toilet.  He had not lost consciousness and at that time he stated he was unable to pull his pants up and fell onto his side when he was trying to do that.  Patient was assisted back to the bed by staff.  It was unclear how long he had been on the floor.    On the morning of admission, when nurses aide went to check on him, patient was unable to get up out of bed and into his chair.  He was unable to even just stand up. Patient was brought to ED, underwent CT scan which showed new stroke in the left caudate nucleus possibly extended into the internal capsule. Patient was admitted due to acute CVA  Assessment & Plan    Principal Problem: Acute left caudate nucleus CVA -In the setting of recent right MCA CVA in 09/2020.  Patient was placed on aspirin, Plavix and atorvastatin in 09/2020. - Neuro consulted.  MRI brain showed acute infarct left anterior basal ganglia, atrophy and extensive chronic ischemic changes - Recent stroke work have been completed,echo in 09/2020 showed EF of 60 to 65% grade 3 DD - PT evaluation recommended SNF -LDL 55, continue statin -Neurology recommended  interrogation of loop recorder.  Per cardiology, no A. fib noted -Continue dual antiplatelet agents for 21 days -Per son's request, palliative consulted and recommendations appreciated DNR/DNI -SLP evaluation recommended dysphagia 3 diet with thin liquids -Doing well, no complaints, awaiting skilled nursing facility bed hopefully tomorrow  Active Problems: Acute kidney injury  -Creatinine 1.7 in 2/22, presented with creatinine of 1.93 on admission -Improved.  Creatinine 1.2    Diabetes mellitus type II, not on long-term insulin, with hyperlipidemia, stroke -Hold metformin, glipizide -Continue sliding scale insulin.  CBGs controlled     Hypertension -BP stable    Dementia (HCC) Continue Zoloft, Wellbutrin, Aricept   Obesity  Estimated body mass index is 30.53 kg/m as calculated from the following:   Height as of this encounter: 5\' 11"  (1.803 m).   Weight as of this encounter: 99.3 kg.  Code Status DNR DVT Prophylaxis:  SCD's Start: 12/13/20 1654   Level of Care: Level of care: Progressive Cardiac Family Communication: Updated patient's son,  Mr Loa Socksllen Blaize on phone   Disposition Plan:     Status is: Inpatient  Remains inpatient appropriate because:Inpatient level of care appropriate due to severity of illness   Dispo: The patient is from: ALF              Anticipated d/c is to: SNF              Patient currently is medically stable to d/c.  Awaiting SNF authorization and bed   Difficult to place patient No      Time Spent in minutes 25 minutes  Procedures:  MRI brain Loop recorder interrogation  Consultants:   Neurology Palliative medicine  Antimicrobials:   Anti-infectives (From admission, onward)   None         Medications  Scheduled Meds: . aspirin EC  81 mg Oral Daily  . atorvastatin  40 mg Oral Daily  . buPROPion  150 mg Oral Daily  . clopidogrel  75 mg Oral Daily  . donepezil  10 mg Oral QHS  . feeding supplement  237 mL Oral TID BM  .  insulin aspart  0-9 Units Subcutaneous TID WC  . multivitamin with minerals  1 tablet Oral Daily  . pantoprazole  20 mg Oral Daily  . sertraline  100 mg Oral Daily   Continuous Infusions:  PRN Meds:.acetaminophen **OR** acetaminophen (TYLENOL) oral liquid 160 mg/5 mL **OR** acetaminophen      Subjective:   [name] was seen and examined today.  No acute complaints.  No issues overnight.  Alert and oriented to self.  Appears comfortable, close to his baseline.  Has dementia  Objective:   Vitals:   12/18/20 0434 12/18/20 0500 12/18/20 0744 12/18/20 0858  BP: 139/72  (!) 166/77 136/67  Pulse: 64  64 61  Resp: 18  18 16   Temp: 98.5 F (36.9 C)  97.6 F (36.4 C) 98 F (36.7 C)  TempSrc: Oral  Oral   SpO2: 93%  94% 94%  Weight:  99.3 kg    Height:        Intake/Output Summary (Last 24 hours) at 12/18/2020 1144 Last data filed at 12/18/2020 0945 Gross per 24 hour  Intake 240 ml  Output 825 ml  Net -585 ml     Wt Readings from Last 3 Encounters:  12/18/20 99.3 kg  11/30/20 90.7 kg  11/07/20 98.4 kg   Physical Exam  General: Alert and oriented x self, NAD  Cardiovascular: S1 S2 clear, RRR. No pedal edema b/l  Respiratory: Fairly CTA B  Gastrointestinal: Soft, nontender, nondistended, NBS  Ext: no pedal edema bilaterally  Neuro: no new deficits  Musculoskeletal: No cyanosis, clubbing  Skin: No rashes  Psych: dementia but appears close to his back  Data Reviewed:  I have personally reviewed following labs and imaging studies  Micro Results Recent Results (from the past 240 hour(s))  Resp Panel by RT-PCR (Flu A&B, Covid) Nasopharyngeal Swab     Status: None   Collection Time: 12/13/20  1:25 PM   Specimen: Nasopharyngeal Swab; Nasopharyngeal(NP) swabs in vial transport medium  Result Value Ref Range Status   SARS Coronavirus 2 by RT PCR NEGATIVE NEGATIVE Final    Comment: (NOTE) SARS-CoV-2 target nucleic acids are NOT DETECTED.  The SARS-CoV-2 RNA  is generally detectable in upper respiratory specimens during the acute phase of infection. The lowest concentration of SARS-CoV-2 viral copies this assay can detect is 138 copies/mL. A negative result does not preclude SARS-Cov-2 infection and  should not be used as the sole basis for treatment or other patient management decisions. A negative result may occur with  improper specimen collection/handling, submission of specimen other than nasopharyngeal swab, presence of viral mutation(s) within the areas targeted by this assay, and inadequate number of viral copies(<138 copies/mL). A negative result must be combined with clinical observations, patient history, and epidemiological information. The expected result is Negative.  Fact Sheet for Patients:  BloggerCourse.com  Fact Sheet for Healthcare Providers:  SeriousBroker.it  This test is no t yet approved or cleared by the [place] FDA and  has been authorized for detection and/or diagnosis of SARS-CoV-2 by FDA under an Emergency Use Authorization (EUA). This EUA will remain  in effect (meaning this test can be used) for the duration of the COVID-19 declaration under Section 564(b)(1) of the Act, 21 U.S.C.section 360bbb-3(b)(1), unless the authorization is terminated  or revoked sooner.       Influenza A by PCR NEGATIVE NEGATIVE Final   Influenza B by PCR NEGATIVE NEGATIVE Final    Comment: (NOTE) The Xpert Xpress SARS-CoV-2/FLU/RSV plus assay is intended as an aid in the diagnosis of influenza from Nasopharyngeal swab specimens and should not be used as a sole basis for treatment. Nasal washings and aspirates are unacceptable for Xpert Xpress SARS-CoV-2/FLU/RSV testing.  Fact Sheet for Patients: BloggerCourse.com  Fact Sheet for Healthcare Providers: SeriousBroker.it  This test is not yet approved or cleared by the  [place] FDA and has been authorized for detection and/or diagnosis of SARS-CoV-2 by FDA under an Emergency Use Authorization (EUA). This EUA will remain in effect (meaning this test can be used) for the duration of the COVID-19 declaration under Section 564(b)(1) of the Act, 21 U.S.C. section 360bbb-3(b)(1), unless the authorization is terminated or revoked.  Performed at [facility], [address] [place], [place] [address]   Resp Panel by RT-PCR (Flu A&B, Covid) Nasopharyngeal Swab     Status: None   Collection Time: 12/15/20  1:16 PM   Specimen: Nasopharyngeal Swab; Nasopharyngeal(NP) swabs in vial transport medium  Result Value Ref Range Status   SARS Coronavirus 2 by RT PCR NEGATIVE NEGATIVE Final    Comment: (NOTE) SARS-CoV-2 target nucleic acids are NOT DETECTED.  The SARS-CoV-2 RNA is generally detectable in upper respiratory specimens during the acute phase of infection. The lowest concentration of SARS-CoV-2 viral copies this assay can detect is 138 copies/mL. A negative result does not preclude SARS-Cov-2 infection and should not be used as the sole basis for treatment or other patient management decisions. A negative result may occur with  improper specimen collection/handling, submission of specimen other than nasopharyngeal swab, presence of viral mutation(s) within the areas targeted by this assay, and inadequate number of viral copies(<138 copies/mL). A negative result must be combined with clinical observations, patient history, and epidemiological information. The expected result is Negative.  Fact Sheet for Patients:  BloggerCourse.com  Fact Sheet for Healthcare Providers:  SeriousBroker.it  This test is no t yet approved or cleared by the [place] FDA and  has been authorized for detection and/or diagnosis of SARS-CoV-2 by FDA under an Emergency Use Authorization (EUA). This EUA will  remain  in effect (meaning this test can be used) for the duration of the COVID-19 declaration under Section 564(b)(1) of the Act, 21 U.S.C.section 360bbb-3(b)(1), unless the authorization is terminated  or revoked sooner.       Influenza A by PCR NEGATIVE NEGATIVE Final   Influenza B by  PCR NEGATIVE NEGATIVE Final    Comment: (NOTE) The Xpert Xpress SARS-CoV-2/FLU/RSV plus assay is intended as an aid in the diagnosis of influenza from Nasopharyngeal swab specimens and should not be used as a sole basis for treatment. Nasal washings and aspirates are unacceptable for Xpert Xpress SARS-CoV-2/FLU/RSV testing.  Fact Sheet for Patients: BloggerCourse.com  Fact Sheet for Healthcare Providers: SeriousBroker.it  This test is not yet approved or cleared by the [place] FDA and has been authorized for detection and/or diagnosis of SARS-CoV-2 by FDA under an Emergency Use Authorization (EUA). This EUA will remain in effect (meaning this test can be used) for the duration of the COVID-19 declaration under Section 564(b)(1) of the Act, 21 U.S.C. section 360bbb-3(b)(1), unless the authorization is terminated or revoked.  Performed at [facility], [address] [place], [place] [address]     Radiology Reports CT Head Wo Contrast  Result Date: 12/13/2020 CLINICAL DATA:  Unwitnessed fall. Found on floor this morning. Mental status change. EXAM: CT HEAD WITHOUT CONTRAST TECHNIQUE: Contiguous axial images were obtained from the base of the skull through the vertex without intravenous contrast. COMPARISON:  10/04/2020 FINDINGS: Brain: There is hypoattenuation involving a significant portion of the left caudate nucleus head, new since the prior CT. This is consistent with a recent infarct. No other evidence of a recent infarct. Small area old infarction involving the left occipital lobe, with another area involving the left  cerebellum, both stable. Age related ventricular and sulcal enlargement. No hydrocephalus. Bilateral patchy white hypoattenuation is noted consistent with advanced chronic microvascular ischemic change, stable. There are no parenchymal masses or mass effect. No extra-axial masses or abnormal fluid collections. No intracranial hemorrhage. Vascular: No hyperdense vessel or unexpected calcification. Skull: Normal. Negative for fracture or focal lesion. Sinuses/Orbits: Globes and orbits are unremarkable. Visualized sinuses are clear. Other: None. IMPRESSION: 1. Infarct involving a significant portion of the left caudate nucleus head, possibly extending to the genu and anterior limb of the left internal capsule. This is new since the prior head CT and is suspected to be recent. Further assessment, to establish chronicity, with unenhanced brain MRI is suggested. 2. No other evidence of a recent infarct. 3. No intracranial hemorrhage. 4. Chronic changes of advanced chronic microvascular ischemic change as well as old left occipital and left cerebellar infarcts. Electronically Signed   By: Amie Portland M.D.   On: 12/13/2020 14:32   MR BRAIN WO CONTRAST  Result Date: 12/13/2020 CLINICAL DATA:  Acute neuro deficit.  Stroke. EXAM: MRI HEAD WITHOUT CONTRAST TECHNIQUE: Multiplanar, multiecho pulse sequences of the brain and surrounding structures were obtained without intravenous contrast. COMPARISON:  CT head 12/13/2020.  MRI head 09/21/2020 FINDINGS: Brain: Acute infarct in the left basal ganglia involving the head of caudate, anterior limb internal capsule, and anterior putamen. No other acute infarct. Resolving small acute infarct in the right parietal lobe which shows restricted diffusion and February 2022. Moderate atrophy. Extensive chronic microvascular ischemic change throughout the white matter bilaterally. Chronic infarcts in the basal ganglia bilaterally, chronic infarct in the right midbrain, and chronic  infarcts in the cerebellum bilaterally. Chronic infarct occipital pole bilaterally left greater than right. Negative for hemorrhage or mass. Vascular: Abnormal flow void in the distal right vertebral artery which may be occluded or have slow flow. This is unchanged. Otherwise normal arterial flow voids. Skull and upper cervical spine: Negative Sinuses/Orbits: Paranasal sinuses clear. Bilateral cataract extraction Other: None IMPRESSION: Acute infarct left anterior basal ganglia. Atrophy and  extensive chronic ischemic change. Electronically Signed   By: Marlan Palau M.D.   On: 12/13/2020 16:21   DG Chest Portable 1 View  Result Date: 12/13/2020 CLINICAL DATA:  Fall today.  Weakness.  Altered mental status. EXAM: PORTABLE CHEST 1 VIEW COMPARISON:  09/06/2013 FINDINGS: Cardiac silhouette is top-normal in size. No mediastinal or hilar masses. Clear lungs.  No convincing pleural effusion.  No pneumothorax. Skeletal structures are grossly intact. IMPRESSION: No active disease. Electronically Signed   By: Amie Portland M.D.   On: 12/13/2020 14:25    Lab Data:  CBC: Recent Labs  Lab 12/13/20 1325 12/15/20 0400  WBC 8.7 7.4  HGB 12.1* 11.8*  HCT 36.4* 35.8*  MCV 95.0 94.7  PLT 101* PLATELET CLUMPS NOTED ON SMEAR, UNABLE TO ESTIMATE   Basic Metabolic Panel: Recent Labs  Lab 12/13/20 1325 12/15/20 0400 12/16/20 0449 12/17/20 0550  NA 142 140 140 137  K 4.2 4.1 4.0 4.0  CL 110 112* 109 107  CO2 GLUCOSE 111* 100* 121* 97  BUN 24* 24* 29* 26*  CREATININE 1.93* 1.81* 1.62* 1.25*  CALCIUM 9.1 8.7* 8.5* 8.4*   GFR: Estimated Creatinine Clearance: 48.1 mL/min (A) (by C-G formula based on SCr of 1.25 mg/dL (H)). Liver Function Tests: Recent Labs  Lab 12/13/20 1325  AST 31  ALT 21  ALKPHOS 101  BILITOT 1.3*  PROT 6.4*  ALBUMIN 3.5   No results for input(s): LIPASE, AMYLASE in the last 168 hours. No results for input(s): AMMONIA in the last 168 hours. Coagulation  Profile: No results for input(s): INR, PROTIME in the last 168 hours. Cardiac Enzymes: Recent Labs  Lab 12/13/20 1714 12/14/20 0359 12/14/20 1658  CKTOTAL 67 171 566*   BNP (last 3 results) No results for input(s): PROBNP in the last 8760 hours. HbA1C: No results for input(s): HGBA1C in the last 72 hours. CBG: Recent Labs  Lab 12/17/20 0741 12/17/20 1136 12/17/20 1549 12/17/20 2118 12/18/20 0900  GLUCAP 102* 133* 131* 228* 115*   Lipid Profile: No results for input(s): CHOL, HDL, LDLCALC, TRIG, CHOLHDL, LDLDIRECT in the last 72 hours. Thyroid Function Tests: No results for input(s): TSH, T4TOTAL, FREET4, T3FREE, THYROIDAB in the last 72 hours. Anemia Panel: No results for input(s): VITAMINB12, FOLATE, FERRITIN, TIBC, IRON, RETICCTPCT in the last 72 hours. Urine analysis:    Component Value Date/Time   COLORURINE YELLOW (A) 10/04/2020 1837   APPEARANCEUR CLEAR (A) 10/04/2020 1837   APPEARANCEUR Clear 09/06/2013 0739   LABSPEC 1.018 10/04/2020 1837   LABSPEC 1.020 09/06/2013 0739   PHURINE 5.0 10/04/2020 1837   GLUCOSEU NEGATIVE 10/04/2020 1837   GLUCOSEU Negative 09/06/2013 0739   HGBUR SMALL (A) 10/04/2020 1837   BILIRUBINUR NEGATIVE 10/04/2020 1837   BILIRUBINUR Negative 09/06/2013 0739   KETONESUR NEGATIVE 10/04/2020 1837   PROTEINUR NEGATIVE 10/04/2020 1837   NITRITE NEGATIVE 10/04/2020 1837   LEUKOCYTESUR SMALL (A) 10/04/2020 1837   LEUKOCYTESUR Negative 09/06/2013 0739     Sachi Boulay M.D. [facility] 12/18/2020, 11:44 AM  Available via Epic secure chat 7am-7pm After 7 pm, please refer to night coverage provider listed on amion.

## 2020-12-19 DIAGNOSIS — F0150 Vascular dementia without behavioral disturbance: Secondary | ICD-10-CM | POA: Diagnosis not present

## 2020-12-19 DIAGNOSIS — E119 Type 2 diabetes mellitus without complications: Secondary | ICD-10-CM | POA: Diagnosis not present

## 2020-12-19 DIAGNOSIS — I10 Essential (primary) hypertension: Secondary | ICD-10-CM | POA: Diagnosis not present

## 2020-12-19 LAB — RESP PANEL BY RT-PCR (FLU A&B, COVID) ARPGX2
Influenza A by PCR: NEGATIVE
Influenza B by PCR: NEGATIVE
SARS Coronavirus 2 by RT PCR: NEGATIVE

## 2020-12-19 LAB — GLUCOSE, CAPILLARY
Glucose-Capillary: 124 mg/dL — ABNORMAL HIGH (ref 70–99)
Glucose-Capillary: 305 mg/dL — ABNORMAL HIGH (ref 70–99)

## 2020-12-19 MED ORDER — LISINOPRIL 20 MG PO TABS: 20.0000 mg | ORAL_TABLET | Freq: Every day | ORAL | 3 refills | Status: AC

## 2020-12-19 NOTE — Care Management Important Message (Signed)
Important Message  Patient Details  Name: Paul Martinez MRN: [id number] Date of Birth: May 01, 1930   Medicare Important Message Given:  Yes     Johnell Comings 12/19/2020, 12:01 PM

## 2020-12-19 NOTE — Discharge Summary (Signed)
Physician Discharge Summary   Patient ID: Paul RiegerWilliam H Martinez MRN: [id number] DOB/AGE: 03-30-1930 87 y.o.  Admit date: 12/13/2020 Discharge date: 12/19/2020  Primary Care Physician:  Barbette ReichmannHande, Vishwanath, MD   Recommendations for Outpatient Follow-up:  1. Follow up with PCP in 1-2 weeks 2. Please obtain BMP in one week  3. Lisinopril decreased to 20 mg daily, follow renal panel in 1 week  Home Health: Patient going to skilled nursing facility Equipment/Devices:   Discharge Condition: stable  CODE STATUS: FULL Diet recommendation: mechanical soft Diet, carb modified   Discharge Diagnoses:    . Acute left caudate nucleus CVA . Dementia (HCC) . Hypertension Acute kidney injury Diabetes mellitus type 2, NIDDM with hyperlipidemia, stroke Dementia Obesity  Consults:  neurology  Palliative medicine   Allergies:   Allergies  Allergen Reactions  . Penicillins     nka is listed on MAR     DISCHARGE MEDICATIONS: Allergies as of 12/19/2020      Reactions   Penicillins    nka is listed on MAR      Medication List    TAKE these medications   acetaminophen 325 MG tablet Commonly known as: TYLENOL Take 650 mg by mouth every 6 (six) hours as needed.   aspirin EC 81 MG tablet Take 81 mg by mouth daily. Swallow whole.   atorvastatin 40 MG tablet Commonly known as: LIPITOR Take 1 tablet (40 mg total) by mouth daily.   buPROPion 150 MG 12 hr tablet Commonly known as: WELLBUTRIN SR Take 150 mg by mouth daily.   CeraVe Crea Apply topically daily as needed (dry skin).   clopidogrel 75 MG tablet Commonly known as: PLAVIX Take 1 tablet (75 mg total) by mouth daily.   dextromethorphan-guaiFENesin 30-600 MG 12hr tablet Commonly known as: MUCINEX DM Take 2 tablets by mouth 3 (three) times daily as needed for cough.   donepezil 10 MG tablet Commonly known as: ARICEPT Take 10 mg by mouth at bedtime.   Doxepin HCl 5 % Crea Apply topically 2 (two) times daily as needed.    Eucrisa 2 % Oint Generic drug: Crisaborole Apply topically 2 (two) times daily as needed.   glipiZIDE 10 MG tablet Commonly known as: GLUCOTROL Take 10 mg by mouth daily.   iron polysaccharides 150 MG capsule Commonly known as: NIFEREX Take 150 mg by mouth daily.   lisinopril 20 MG tablet Commonly known as: ZESTRIL Take 1 tablet (20 mg total) by mouth daily. What changed:   medication strength  how much to take   metFORMIN 500 MG tablet Commonly known as: GLUCOPHAGE Take 500 mg by mouth 2 (two) times daily with a meal.   pantoprazole 20 MG tablet Commonly known as: PROTONIX Take 20 mg by mouth daily.   PRESERVISION AREDS 2+MULTI VIT PO Take 1 tablet by mouth 2 (two) times daily.   sertraline 50 MG tablet Commonly known as: ZOLOFT Take 100 mg by mouth daily.   vitamin B-12 1000 MCG tablet Commonly known as: CYANOCOBALAMIN Take 1,000 mcg by mouth daily.        Brief H and P: For complete details please refer to admission H and P, but in brief *Patient is a 87 year old male with history of dementia, lives in ALF, hypertension, diabetes mellitus type 2, GERD, history of ICH, thrombocytopenia and recent history of stroke in February 2022, was in his usual state of health until yesterday when he was found on the ground next to the toilet. He had not lost consciousness  and at that time he stated he was unable to pull his pants up and fell onto his side when he was trying to do that. Patient was assisted back to the bed by staff. It was unclear how long he had been on the floor.   On the morning of admission, when nurses aide went to check on him, patient was unable to get up out of bed and into his chair.  He was unable to even just stand up. Patient was brought to ED, underwent CT scan which showed new stroke in the left caudate nucleus possibly extended into the internal capsule. Patient was admitted due to acute CVA   Hospital Course:   Acute left caudate  nucleus CVA -In the setting of recent right MCA CVA in 09/2020.  Patient was placed on aspirin, Plavix and atorvastatin in 09/2020. -Neurology was consulted. MRI brain showed acute infarct left anterior basal ganglia, atrophy and extensive chronic ischemic changes - Recent stroke work have been completed,echo in 09/2020 showed EF of 60 to 65% grade 3 DD - PT evaluation recommended SNF -LDL 55, continue statin -Neurology recommended interrogation of loop recorder.  Per cardiology, no A. fib noted on the interrogation -Continue dual antiplatelet agents for 21 days, outpatient follow-up with neurology -Per son's request, palliative consulted and recommendations appreciated DNR/DNI -SLP evaluation recommended dysphagia 3 diet with thin liquids, CARB MODIFIED diet     Acute kidney injury  -Creatinine 1.7 in 2/22, presented with creatinine of 1.93 on admission -Improved.  Creatinine 1.2 -Lisinopril decreased to 20 mg daily    Diabetes mellitus type II, not on long-term insulin, with hyperlipidemia, stroke -Resume metformin, glipizide     Hypertension -BP stable    Dementia (HCC) Continue Zoloft, Wellbutrin, Aricept   Obesity  Estimated body mass index is 30.53 kg/m as calculated from the following:   Height as of this encounter: 5\' 11"  (1.803 m).   Weight as of this encounter: 99.3 kg.  Day of Discharge S: No acute complaints, has dementia.  No acute events overnight, no fevers or chills.  BP 130/67 (BP Location: Left Arm)   Pulse 64   Temp 97.6 F (36.4 C)   Resp 18   Ht 5\' 11"  (1.803 m)   Wt 98.1 kg   SpO2 96%   BMI 30.17 kg/m   Physical Exam: General: Alert and awake oriented x to self, not in any acute distress. CVS: S1-S2 clear no murmur rubs or gallops Chest: clear to auscultation bilaterally, no wheezing rales or rhonchi Abdomen: soft nontender, nondistended, normal bowel sounds Extremities: no cyanosis, clubbing or edema noted bilaterally     Get  Medicines reviewed and adjusted: Please take all your medications with you for your next visit with your Primary MD  Please request your Primary MD to go over all hospital tests and procedure/radiological results at the follow up. Please ask your Primary MD to get all Hospital records sent to his/her office.  If you experience worsening of your admission symptoms, develop shortness of breath, life threatening emergency, suicidal or homicidal thoughts you must seek medical attention immediately by calling 911 or calling your MD immediately  if symptoms less severe.  You must read complete instructions/literature along with all the possible adverse reactions/side effects for all the Medicines you take and that have been prescribed to you. Take any new Medicines after you have completely understood and accept all the possible adverse reactions/side effects.   Do not drive when taking pain medications.  Do not take more than prescribed Pain, Sleep and Anxiety Medications  Special Instructions: If you have smoked or chewed Tobacco  in the last 2 yrs please stop smoking, stop any regular Alcohol  and or any Recreational drug use.  Wear Seat belts while driving.  Please note  You were cared for by a hospitalist during your hospital stay. Once you are discharged, your primary care physician will handle any further medical issues. Please note that NO REFILLS for any discharge medications will be authorized once you are discharged, as it is imperative that you return to your primary care physician (or establish a relationship with a primary care physician if you do not have one) for your aftercare needs so that they can reassess your need for medications and monitor your lab values.   The results of significant diagnostics from this hospitalization (including imaging, microbiology, ancillary and laboratory) are listed below for reference.      Procedures/Studies:  CT Head Wo Contrast  Result  Date: 12/13/2020 CLINICAL DATA:  Unwitnessed fall. Found on floor this morning. Mental status change. EXAM: CT HEAD WITHOUT CONTRAST TECHNIQUE: Contiguous axial images were obtained from the base of the skull through the vertex without intravenous contrast. COMPARISON:  10/04/2020 FINDINGS: Brain: There is hypoattenuation involving a significant portion of the left caudate nucleus head, new since the prior CT. This is consistent with a recent infarct. No other evidence of a recent infarct. Small area old infarction involving the left occipital lobe, with another area involving the left cerebellum, both stable. Age related ventricular and sulcal enlargement. No hydrocephalus. Bilateral patchy white hypoattenuation is noted consistent with advanced chronic microvascular ischemic change, stable. There are no parenchymal masses or mass effect. No extra-axial masses or abnormal fluid collections. No intracranial hemorrhage. Vascular: No hyperdense vessel or unexpected calcification. Skull: Normal. Negative for fracture or focal lesion. Sinuses/Orbits: Globes and orbits are unremarkable. Visualized sinuses are clear. Other: None. IMPRESSION: 1. Infarct involving a significant portion of the left caudate nucleus head, possibly extending to the genu and anterior limb of the left internal capsule. This is new since the prior head CT and is suspected to be recent. Further assessment, to establish chronicity, with unenhanced brain MRI is suggested. 2. No other evidence of a recent infarct. 3. No intracranial hemorrhage. 4. Chronic changes of advanced chronic microvascular ischemic change as well as old left occipital and left cerebellar infarcts. Electronically Signed   By: Amie Portland M.D.   On: 12/13/2020 14:32   MR BRAIN WO CONTRAST  Result Date: 12/13/2020 CLINICAL DATA:  Acute neuro deficit.  Stroke. EXAM: MRI HEAD WITHOUT CONTRAST TECHNIQUE: Multiplanar, multiecho pulse sequences of the brain and surrounding  structures were obtained without intravenous contrast. COMPARISON:  CT head 12/13/2020.  MRI head 09/21/2020 FINDINGS: Brain: Acute infarct in the left basal ganglia involving the head of caudate, anterior limb internal capsule, and anterior putamen. No other acute infarct. Resolving small acute infarct in the right parietal lobe which shows restricted diffusion and February 2022. Moderate atrophy. Extensive chronic microvascular ischemic change throughout the white matter bilaterally. Chronic infarcts in the basal ganglia bilaterally, chronic infarct in the right midbrain, and chronic infarcts in the cerebellum bilaterally. Chronic infarct occipital pole bilaterally left greater than right. Negative for hemorrhage or mass. Vascular: Abnormal flow void in the distal right vertebral artery which may be occluded or have slow flow. This is unchanged. Otherwise normal arterial flow voids. Skull and upper cervical spine: Negative Sinuses/Orbits: Paranasal sinuses clear.  Bilateral cataract extraction Other: None IMPRESSION: Acute infarct left anterior basal ganglia. Atrophy and extensive chronic ischemic change. Electronically Signed   By: Marlan Palau M.D.   On: 12/13/2020 16:21   DG Chest Portable 1 View  Result Date: 12/13/2020 CLINICAL DATA:  Fall today.  Weakness.  Altered mental status. EXAM: PORTABLE CHEST 1 VIEW COMPARISON:  09/06/2013 FINDINGS: Cardiac silhouette is top-normal in size. No mediastinal or hilar masses. Clear lungs.  No convincing pleural effusion.  No pneumothorax. Skeletal structures are grossly intact. IMPRESSION: No active disease. Electronically Signed   By: Amie Portland M.D.   On: 12/13/2020 14:25       LAB RESULTS: Basic Metabolic Panel: Recent Labs  Lab 12/16/20 0449 12/17/20 0550  NA 140 137  K 4.0 4.0  CL 109 107  CO2 25 24  GLUCOSE 121* 97  BUN 29* 26*  CREATININE 1.62* 1.25*  CALCIUM 8.5* 8.4*   Liver Function Tests: Recent Labs  Lab 12/13/20 1325  AST 31   ALT 21  ALKPHOS 101  BILITOT 1.3*  PROT 6.4*  ALBUMIN 3.5   No results for input(s): LIPASE, AMYLASE in the last 168 hours. No results for input(s): AMMONIA in the last 168 hours. CBC: Recent Labs  Lab 12/13/20 1325 12/15/20 0400  WBC 8.7 7.4  HGB 12.1* 11.8*  HCT 36.4* 35.8*  MCV 95.0 94.7  PLT 101* PLATELET CLUMPS NOTED ON SMEAR, UNABLE TO ESTIMATE   Cardiac Enzymes: Recent Labs  Lab 12/14/20 0359 12/14/20 1658  CKTOTAL 171 566*   BNP: Invalid input(s): POCBNP CBG: Recent Labs  Lab 12/19/20 0850 12/19/20 1138  GLUCAP 124* 305*       Disposition and Follow-up: Discharge Instructions    Diet Carb Modified   Complete by: As directed    Increase activity slowly   Complete by: As directed        DISPOSITION: Skilled nursing facility   DISCHARGE FOLLOW-UP  Contact information for follow-up providers    Barbette Reichmann, MD. Schedule an appointment as soon as possible for a visit in 2 week(s).   Specialty: Internal Medicine Contact information: [address] [place] [address] [phone]        Lanier Prude, MD .   Specialties: Cardiology, Radiology Contact information: [address] [place] [address] [place] [address] [phone]            Contact information for after-discharge care    Destination    [facility] .   Service: Skilled Nursing Contact information: [address] [place] [address] [phone]                   Time coordinating discharge:  35 minutes  Signed:   Thad Ranger M.D. [facility] 12/19/2020, 12:26 PM

## 2020-12-19 NOTE — TOC Transition Note (Addendum)
Transition of Care [facility]) - CM/SW Discharge Note   Patient Details  Name: Paul Martinez MRN: [id number] Date of Birth: [birth year]  [facility] CM/SW Contact:  Hetty Ely, RN Phone Number: 12/19/2020, 12:37 PM   Son returned call given information that patient will be transported to [facility] today, voices relief and appreciative of call.   Final next level of care: Skilled Nursing Facility Barriers to Discharge: Barriers Resolved   Patient Goals and CMS Choice Patient states their goals for this hospitalization and ongoing recovery are:: To SNF [facility] [email or web address] Compare Post Acute Care list provided to:: Patient Represenative (must comment) Choice offered to / list presented to : Adult Children  Discharge Placement                Patient to be transferred to facility by: [facility] Name of family member notified: mailbox full unable to leave message, however talked with Son on Friday. Patient and family notified of of transfer: 12/19/20  Discharge Plan and Services In-house Referral: Clinical Social Work   Post Acute Care Choice: Skilled Nursing Facility          DME Arranged: N/A DME Agency: NA       HH Arranged: NA HH Agency: NA        Social Determinants of Health (SDOH) Interventions     Readmission Risk Interventions No flowsheet data found.

## 2021-01-04 ENCOUNTER — Ambulatory Visit (INDEPENDENT_AMBULATORY_CARE_PROVIDER_SITE_OTHER): Payer: Medicare Other

## 2021-01-04 DIAGNOSIS — I63019 Cerebral infarction due to thrombosis of unspecified vertebral artery: Secondary | ICD-10-CM | POA: Diagnosis not present

## 2021-01-04 LAB — CUP PACEART REMOTE DEVICE CHECK
Date Time Interrogation Session: 20220524101827
Implantable Pulse Generator Implant Date: 20220420

## 2021-01-25 NOTE — Progress Notes (Signed)
Carelink Summary Report / Loop Recorder 

## 2021-02-06 ENCOUNTER — Ambulatory Visit (INDEPENDENT_AMBULATORY_CARE_PROVIDER_SITE_OTHER): Payer: Medicare Other

## 2021-02-06 DIAGNOSIS — I63019 Cerebral infarction due to thrombosis of unspecified vertebral artery: Secondary | ICD-10-CM | POA: Diagnosis not present

## 2021-02-06 LAB — CUP PACEART REMOTE DEVICE CHECK
Date Time Interrogation Session: 20220626101843
Implantable Pulse Generator Implant Date: 20220420

## 2021-02-22 NOTE — Progress Notes (Signed)
Carelink Summary Report / Loop Recorder 

## 2021-03-11 LAB — CUP PACEART REMOTE DEVICE CHECK
Date Time Interrogation Session: 20220729101557
Implantable Pulse Generator Implant Date: 20220420

## 2021-03-13 ENCOUNTER — Ambulatory Visit (INDEPENDENT_AMBULATORY_CARE_PROVIDER_SITE_OTHER): Payer: Medicare Other

## 2021-03-13 DIAGNOSIS — I63019 Cerebral infarction due to thrombosis of unspecified vertebral artery: Secondary | ICD-10-CM

## 2021-04-05 NOTE — Progress Notes (Signed)
Carelink Summary Report / Loop Recorder 

## 2021-04-14 ENCOUNTER — Ambulatory Visit (INDEPENDENT_AMBULATORY_CARE_PROVIDER_SITE_OTHER): Payer: Medicare Other

## 2021-04-14 DIAGNOSIS — I63019 Cerebral infarction due to thrombosis of unspecified vertebral artery: Secondary | ICD-10-CM

## 2021-04-18 LAB — CUP PACEART REMOTE DEVICE CHECK
Date Time Interrogation Session: 20220831101543
Implantable Pulse Generator Implant Date: 20220420

## 2021-04-25 NOTE — Progress Notes (Signed)
Carelink Summary Report / Loop Recorder 

## 2021-05-02 ENCOUNTER — Telehealth: Payer: Self-pay

## 2021-05-02 NOTE — Telephone Encounter (Signed)
Carelink LINQ alert received.  Log indicates 3 new AF events No EGM's for review.  Pt implanted 11/30/20 for CVA, no documented episodes to date.  Pt has a linq2 with relay monitor, therefore will need to force transmission to provide the data regarding episodes.    Left generic VM (No DPR on file) with [facility] number and hours to return call.

## 2021-05-03 NOTE — Telephone Encounter (Signed)
Patient has ILR 2, so unable to force remote transmission. Portia messaged [facility] today requesting assistance. Awaiting response.

## 2021-05-03 NOTE — Telephone Encounter (Signed)
The patient son called returning nurse phone call. I let him know that the nurse is waiting on the [employer] rep to get the information the nurse need. Once she receive the information she will give him a call back.

## 2021-05-05 NOTE — Telephone Encounter (Signed)
Still waiting for [employer] rep to get back with [place].

## 2021-05-12 NOTE — Telephone Encounter (Signed)
Attempted to call patient back to obtain permission to speak to son regarding recent loop recorder events. Patients number is also sons listed number. Per [employer] rep, episodes logged are false, therefore no EGMs to review. This is determined by a device program arhythmia algorithm. Unable to leave message as mailbox is full. Will close encounter.

## 2021-05-18 LAB — CUP PACEART REMOTE DEVICE CHECK
Date Time Interrogation Session: 20221003101553
Implantable Pulse Generator Implant Date: 20220420

## 2021-05-22 ENCOUNTER — Ambulatory Visit (INDEPENDENT_AMBULATORY_CARE_PROVIDER_SITE_OTHER): Payer: Medicare Other

## 2021-05-22 DIAGNOSIS — I63019 Cerebral infarction due to thrombosis of unspecified vertebral artery: Secondary | ICD-10-CM | POA: Diagnosis not present

## 2021-05-30 NOTE — Progress Notes (Signed)
Carelink Summary Report / Loop Recorder 

## 2021-06-20 LAB — CUP PACEART REMOTE DEVICE CHECK
Date Time Interrogation Session: 20221105101949
Implantable Pulse Generator Implant Date: 20220420

## 2021-06-26 ENCOUNTER — Ambulatory Visit (INDEPENDENT_AMBULATORY_CARE_PROVIDER_SITE_OTHER): Payer: Medicare Other

## 2021-06-26 DIAGNOSIS — I63019 Cerebral infarction due to thrombosis of unspecified vertebral artery: Secondary | ICD-10-CM

## 2021-07-03 NOTE — Progress Notes (Signed)
Carelink Summary Report / Loop Recorder 

## 2021-07-31 ENCOUNTER — Ambulatory Visit (INDEPENDENT_AMBULATORY_CARE_PROVIDER_SITE_OTHER): Payer: Medicare Other

## 2021-07-31 DIAGNOSIS — I63019 Cerebral infarction due to thrombosis of unspecified vertebral artery: Secondary | ICD-10-CM

## 2021-07-31 LAB — CUP PACEART REMOTE DEVICE CHECK
Date Time Interrogation Session: 20221218000242
Implantable Pulse Generator Implant Date: 20220420

## 2021-08-09 NOTE — Progress Notes (Signed)
Carelink Summary Report / Loop Recorder 

## 2021-09-16 IMAGING — CT CT HEAD W/O CM
3 series · 15 of 47 positions shown, 18 images · non-contrast
Comparison: 10/04/2020

CLINICAL DATA: Unwitnessed fall. Found on floor this morning.
Mental status change.

EXAM:
CT HEAD WITHOUT CONTRAST
TECHNIQUE: Contiguous axial images were obtained from the base of the skull
through the vertex without intravenous contrast.

[Series 2: head wo · axial · 0.43mm/px · z∈[+388,+513]mm · 9 of 31 slices shown, 12 images]
[im 3/31  brain]
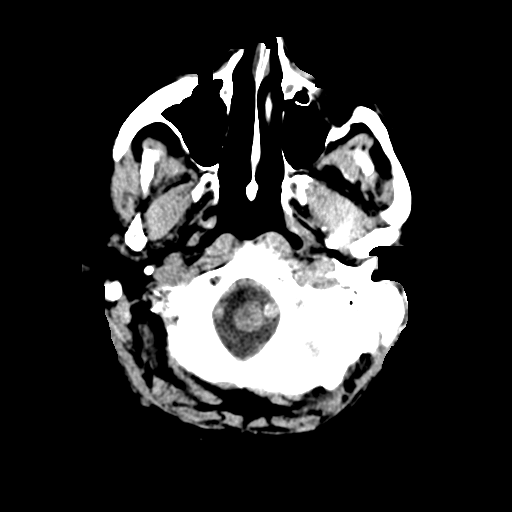
[im 3/31  bone]
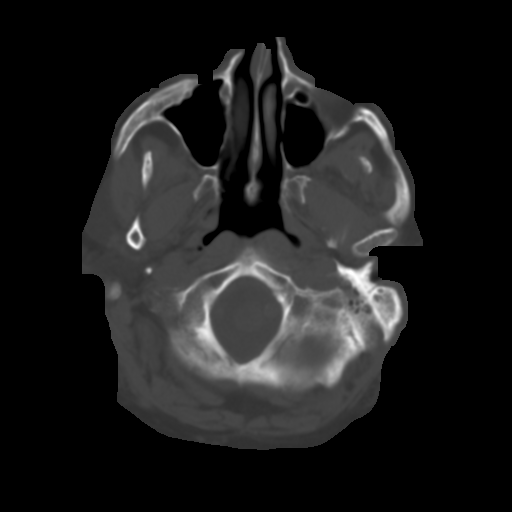
[im 6/31  brain]
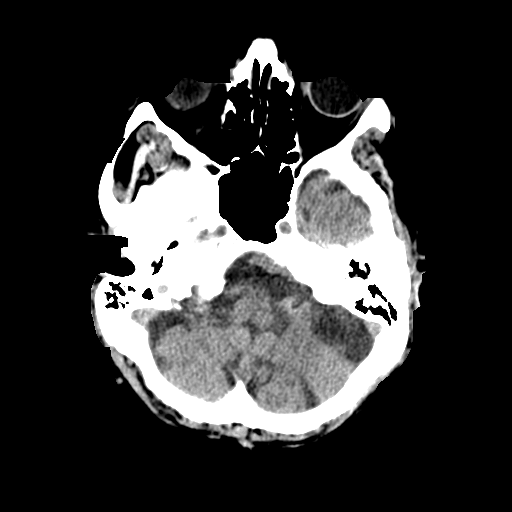
[im 9/31  brain]
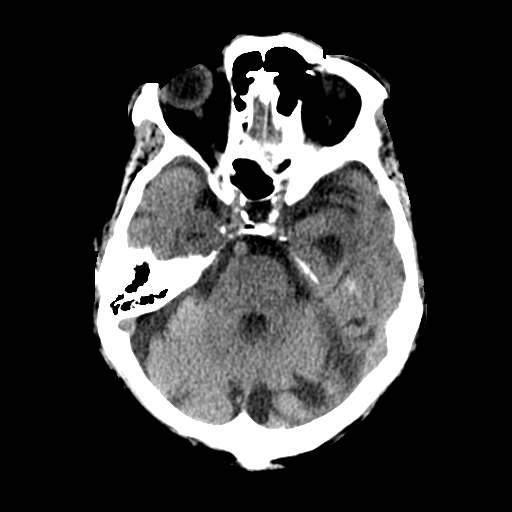
[im 12/31  brain]
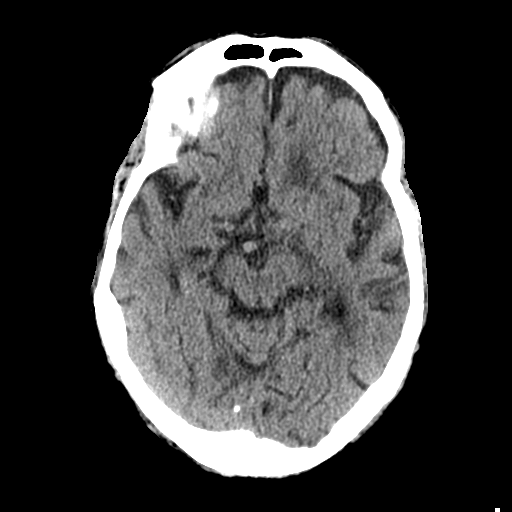
[im 16/31  brain]
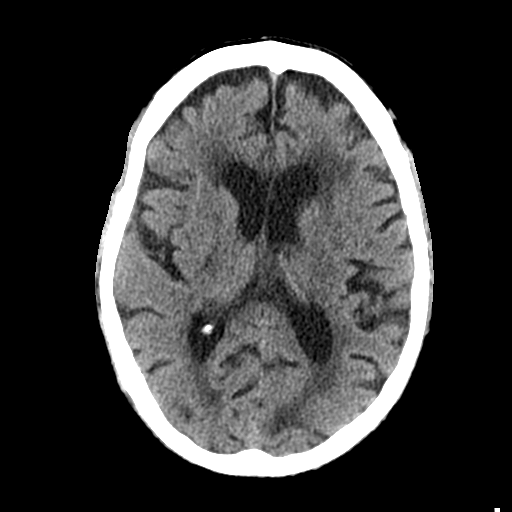
[im 16/31  bone]
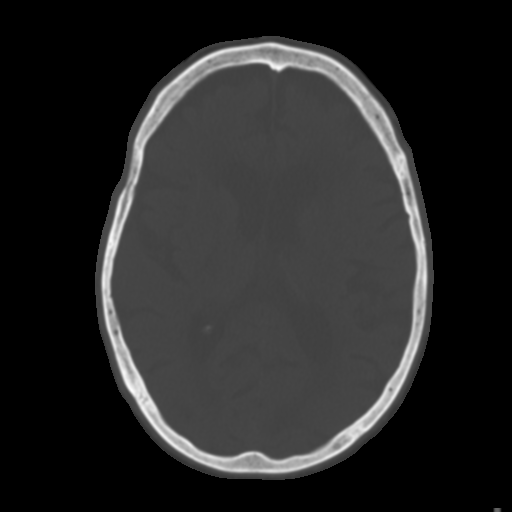
[im 19/31  brain]
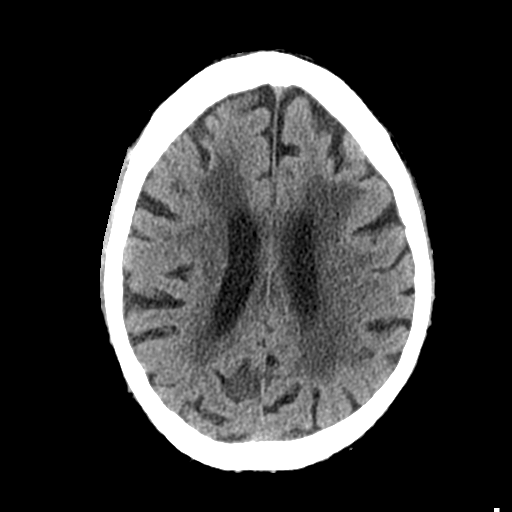
[im 22/31  brain]
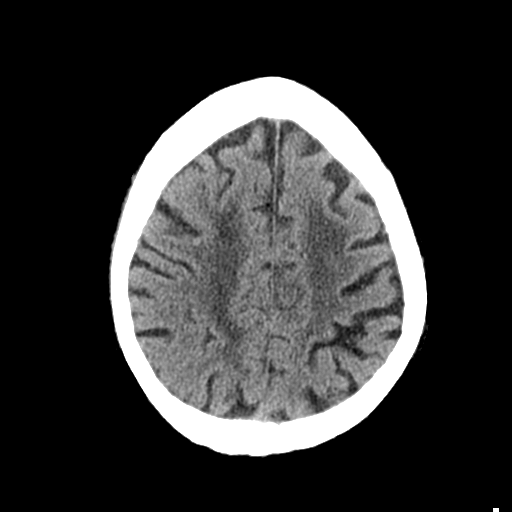
[im 25/31  brain]
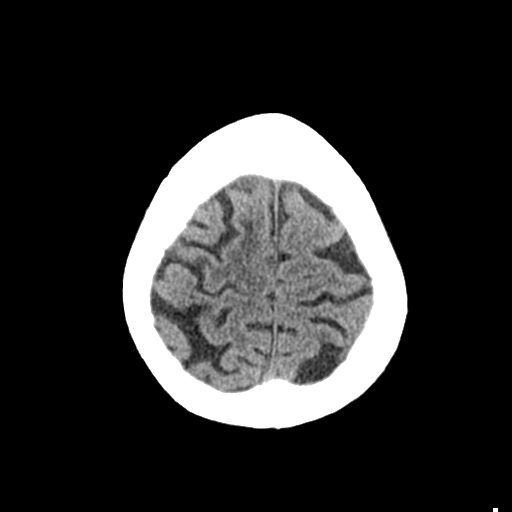
[im 28/31  brain]
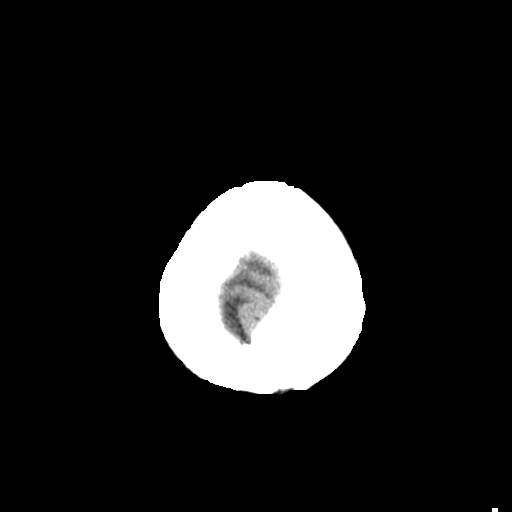
[im 28/31  bone]
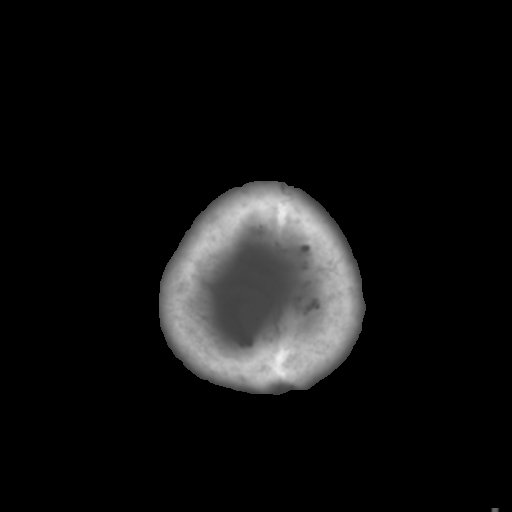

[Series 4: coronal soft tissue · coronal · 0.33mm/px · 3 of 70 slices shown]
[im 24/70  brain]
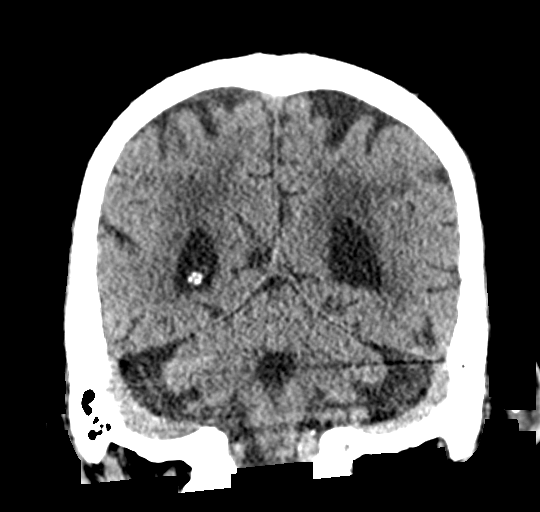
[im 31/70  brain]
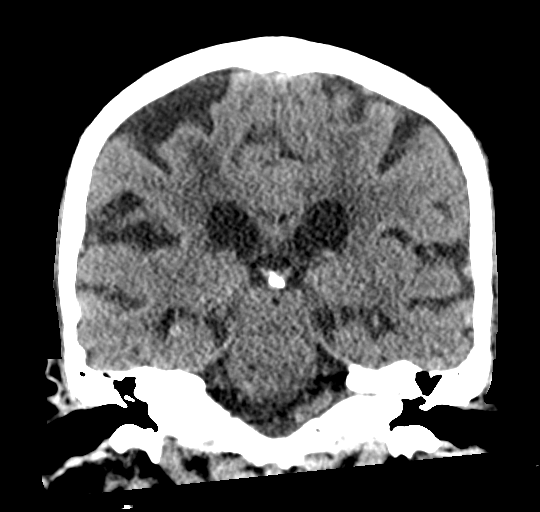
[im 39/70  brain]
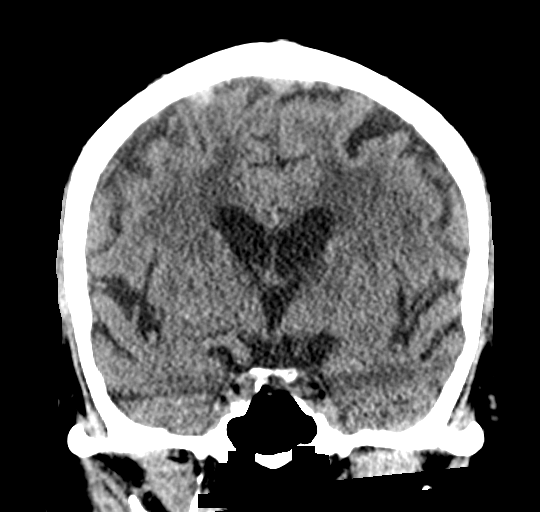

[Series 5: sagittal soft tissue · sagittal · 0.32mm/px · 3 of 59 slices shown]
[im 20/59  brain]
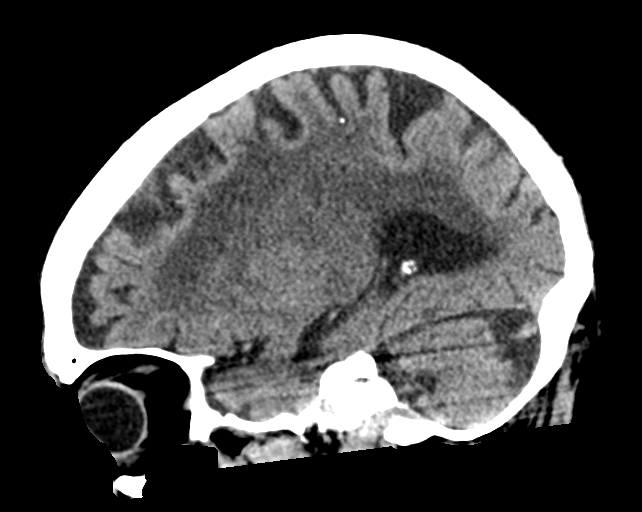
[im 30/59  brain]
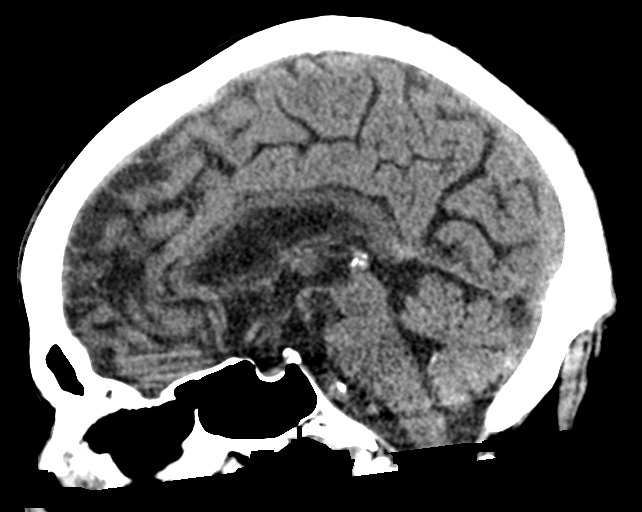
[im 39/59  brain]
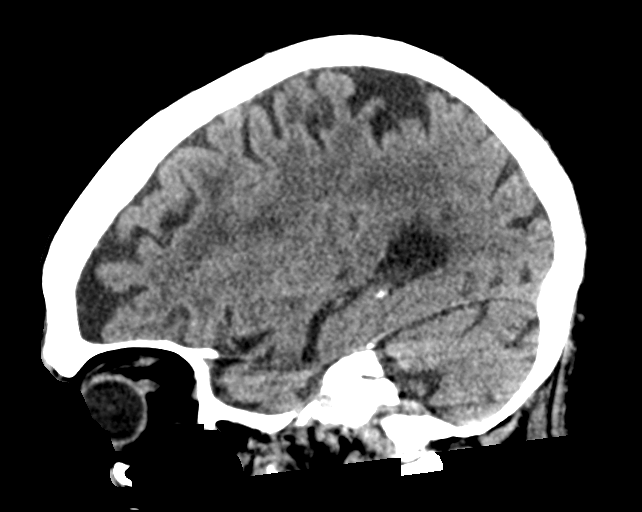

[15 of 47 positions shown; findings below may reference images not displayed]

FINDINGS: Brain: There is hypoattenuation involving a significant portion of
the left caudate nucleus head, new since the prior CT. This is
consistent with a recent infarct. No other evidence of a recent
infarct. Small area old infarction involving the left occipital
lobe, with another area involving the left cerebellum, both stable.

Age related ventricular and sulcal enlargement. No hydrocephalus.
Bilateral patchy white hypoattenuation is noted consistent with
advanced chronic microvascular ischemic change, stable. There are no
parenchymal masses or mass effect. No extra-axial masses or abnormal
fluid collections.

No intracranial hemorrhage.

Vascular: No hyperdense vessel or unexpected calcification.

Skull: Normal. Negative for fracture or focal lesion.

Sinuses/Orbits: Globes and orbits are unremarkable. Visualized
sinuses are clear.

Other: None.
IMPRESSION: 1. Infarct involving a significant portion of the left caudate
nucleus head, possibly extending to the genu and anterior limb of
the left internal capsule. This is new since the prior head CT and
is suspected to be recent. Further assessment, to establish
chronicity, with unenhanced brain MRI is suggested.
2. No other evidence of a recent infarct.
3. No intracranial hemorrhage.
4. Chronic changes of advanced chronic microvascular ischemic change
as well as old left occipital and left cerebellar infarcts.
